# Patient Record
Sex: Female | Born: 1965 | Race: White | Hispanic: No | Marital: Married | Smoking: Never smoker
Health system: Southern US, Community
[De-identification: ages and names within clinical notes are randomized; demographics above are authoritative.]

## PROBLEM LIST (undated history)

## (undated) DIAGNOSIS — F419 Anxiety disorder, unspecified: Secondary | ICD-10-CM

## (undated) DIAGNOSIS — D649 Anemia, unspecified: Secondary | ICD-10-CM

## (undated) DIAGNOSIS — I10 Essential (primary) hypertension: Secondary | ICD-10-CM

## (undated) DIAGNOSIS — R519 Headache, unspecified: Secondary | ICD-10-CM

## (undated) DIAGNOSIS — R51 Headache: Secondary | ICD-10-CM

## (undated) DIAGNOSIS — K219 Gastro-esophageal reflux disease without esophagitis: Secondary | ICD-10-CM

## (undated) HISTORY — PX: POLYPECTOMY: SHX149

## (undated) HISTORY — DX: Essential (primary) hypertension: I10

## (undated) HISTORY — DX: Anxiety disorder, unspecified: F41.9

## (undated) HISTORY — PX: BREAST LUMPECTOMY: SHX2

---

## 1998-08-25 ENCOUNTER — Other Ambulatory Visit: Admission: RE | Admit: 1998-08-25 | Discharge: 1998-08-25 | Payer: Self-pay | Admitting: Obstetrics and Gynecology

## 1999-04-25 ENCOUNTER — Encounter: Admission: RE | Admit: 1999-04-25 | Discharge: 1999-07-24 | Payer: Self-pay | Admitting: Obstetrics and Gynecology

## 1999-07-12 ENCOUNTER — Inpatient Hospital Stay (HOSPITAL_COMMUNITY): Admission: AD | Admit: 1999-07-12 | Discharge: 1999-07-15 | Payer: Self-pay | Admitting: Obstetrics and Gynecology

## 1999-07-12 ENCOUNTER — Encounter (INDEPENDENT_AMBULATORY_CARE_PROVIDER_SITE_OTHER): Payer: Self-pay

## 1999-08-17 ENCOUNTER — Other Ambulatory Visit: Admission: RE | Admit: 1999-08-17 | Discharge: 1999-08-17 | Payer: Self-pay | Admitting: Obstetrics and Gynecology

## 2002-03-04 ENCOUNTER — Other Ambulatory Visit: Admission: RE | Admit: 2002-03-04 | Discharge: 2002-03-04 | Payer: Self-pay | Admitting: Obstetrics and Gynecology

## 2003-07-27 ENCOUNTER — Emergency Department (HOSPITAL_COMMUNITY): Admission: EM | Admit: 2003-07-27 | Discharge: 2003-07-27 | Payer: Self-pay | Admitting: Emergency Medicine

## 2003-07-27 ENCOUNTER — Encounter: Payer: Self-pay | Admitting: *Deleted

## 2006-07-10 ENCOUNTER — Encounter (INDEPENDENT_AMBULATORY_CARE_PROVIDER_SITE_OTHER): Payer: Self-pay | Admitting: Specialist

## 2006-07-10 ENCOUNTER — Encounter: Admission: RE | Admit: 2006-07-10 | Discharge: 2006-07-10 | Payer: Self-pay | Admitting: Obstetrics and Gynecology

## 2006-08-07 ENCOUNTER — Encounter: Admission: RE | Admit: 2006-08-07 | Discharge: 2006-08-07 | Payer: Self-pay | Admitting: Obstetrics and Gynecology

## 2007-02-20 ENCOUNTER — Emergency Department (HOSPITAL_COMMUNITY): Admission: EM | Admit: 2007-02-20 | Discharge: 2007-02-20 | Payer: Self-pay | Admitting: Emergency Medicine

## 2007-03-27 ENCOUNTER — Encounter: Admission: RE | Admit: 2007-03-27 | Discharge: 2007-03-27 | Payer: Self-pay | Admitting: Obstetrics and Gynecology

## 2007-04-04 ENCOUNTER — Encounter (INDEPENDENT_AMBULATORY_CARE_PROVIDER_SITE_OTHER): Payer: Self-pay | Admitting: Specialist

## 2007-04-04 ENCOUNTER — Encounter: Admission: RE | Admit: 2007-04-04 | Discharge: 2007-04-04 | Payer: Self-pay | Admitting: Family Medicine

## 2007-05-19 ENCOUNTER — Encounter (INDEPENDENT_AMBULATORY_CARE_PROVIDER_SITE_OTHER): Payer: Self-pay | Admitting: Surgery

## 2007-05-19 ENCOUNTER — Ambulatory Visit (HOSPITAL_BASED_OUTPATIENT_CLINIC_OR_DEPARTMENT_OTHER): Admission: RE | Admit: 2007-05-19 | Discharge: 2007-05-19 | Payer: Self-pay | Admitting: Surgery

## 2010-05-14 ENCOUNTER — Emergency Department (HOSPITAL_COMMUNITY): Admission: EM | Admit: 2010-05-14 | Discharge: 2010-05-14 | Payer: Self-pay | Admitting: Emergency Medicine

## 2011-03-19 LAB — URINALYSIS, ROUTINE W REFLEX MICROSCOPIC
Nitrite: NEGATIVE
Protein, ur: NEGATIVE mg/dL
Specific Gravity, Urine: <1.005 — ABNORMAL LOW (ref 1.005–1.030)
Urobilinogen, UA: 0.2 mg/dL (ref 0.0–1.0)

## 2011-03-19 LAB — URINE MICROSCOPIC-ADD ON

## 2011-03-19 LAB — URINE CULTURE

## 2011-05-18 NOTE — Op Note (Signed)
NAME:  Jennifer Landry, Jennifer Landry NO.:  [id number]   MEDICAL RECORD NO.:  [id number]          PATIENT TYPE:  AMB   LOCATION:  DSC                          FACILITY:  [facility]   PHYSICIAN:  Currie Paris, M.D.DATE OF BIRTH:  17-Jul-1966   DATE OF PROCEDURE:  05/19/2007  DATE OF DISCHARGE:                               OPERATIVE REPORT   OFFICE MEDICAL RECORD NUMBER [id number] [phone].   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   OPERATION:  Removal of right breast mass.   SURGEON:  Currie Paris, M.D.   ANESTHESIA:  MAC.   CLINICAL HISTORY:  This a 40-year lady who has had a prior right breast  biopsy apparently for a fibroadenoma done many years ago.  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   DESCRIPTION OF PROCEDURE:  The patient was seen in the holding area and  she had no further questions.  The right breast was marked as the  operative site. She is taken to the operating room.   In the operating room the mass, itself, was palpated and marked.  I also  used the ultrasound to identify and confirm the area that was palpable  was the area previously seen; and it was.  She then underwent IV  sedation and the breast was prepped and draped; and the time-out  occurred.   I infiltrated a combination of 1% plain Xylocaine and 0.5% Marcaine with  epi.  An incision was made.  I divided a little bit of subcutaneous  tissues.  I could palpate the mass; and attempted to put a 3-0 Vicryl  suture through it for traction.  Having placed that, and using some  traction I tried to come around the mass using a cutting current of the  cautery, taking a little bit of the fatty tissue.  As I was  working  around medially I found that I really had traction in the fatty tissue  with the suture; and the needle had come across the top of the mass  without actually grasping it.  I could, however, see the breast edge  adjacent to the mass.  I grasped that with an Revonda Standard, using that for  traction, and then did excisional biopsy trying to get a little bit of  margin in all directions, although very thin.  Grossly it would appear  that there was a positive margin simply because of some of the traction;  but, I believe, that we did have a little bit of a margin of all  directions.   The specimen was oriented for pathology.  The incision was checked for  hemostasis; and once everything was dry, closed in layers with 3-0  Vicryl followed by 4-0 Monocryl subcuticular and Dermabond.   The patient tolerated the procedure well. There no operative  complications.  All counts were correct.      Currie Paris, M.D.  Electronically Signed     CJS/MEDQ  D:  05/19/2007  T:  05/19/2007  Job:  [id number]   cc:   Miguel Aschoff, M.D.

## 2011-11-13 ENCOUNTER — Other Ambulatory Visit: Payer: Self-pay | Admitting: Obstetrics and Gynecology

## 2011-12-01 HISTORY — PX: COLPOSCOPY: SHX161

## 2011-12-05 ENCOUNTER — Other Ambulatory Visit: Payer: Self-pay | Admitting: Obstetrics and Gynecology

## 2014-01-27 ENCOUNTER — Other Ambulatory Visit: Payer: Self-pay | Admitting: Obstetrics and Gynecology

## 2015-02-03 ENCOUNTER — Other Ambulatory Visit: Payer: Self-pay | Admitting: Obstetrics and Gynecology

## 2015-02-04 LAB — CYTOLOGY - PAP

## 2016-03-29 ENCOUNTER — Other Ambulatory Visit: Payer: Self-pay | Admitting: Obstetrics & Gynecology

## 2016-03-29 DIAGNOSIS — R928 Other abnormal and inconclusive findings on diagnostic imaging of breast: Secondary | ICD-10-CM

## 2016-04-06 ENCOUNTER — Ambulatory Visit
Admission: RE | Admit: 2016-04-06 | Discharge: 2016-04-06 | Disposition: A | Discharge status: Home / Self Care | Payer: BLUE CROSS/BLUE SHIELD | Source: Ambulatory Visit | Attending: Obstetrics & Gynecology | Admitting: Obstetrics & Gynecology

## 2016-04-06 DIAGNOSIS — R928 Other abnormal and inconclusive findings on diagnostic imaging of breast: Secondary | ICD-10-CM

## 2016-12-28 ENCOUNTER — Encounter: Payer: Self-pay | Admitting: Gynecologic Oncology

## 2017-01-01 ENCOUNTER — Telehealth: Payer: Self-pay | Admitting: Gynecologic Oncology

## 2017-01-01 NOTE — Telephone Encounter (Signed)
Tc to the referring office to make them aware of the appt that has been scheduled for the to see Dr. Denman George on 1/19 at 10:45am. Left a vm for them to contact the pt with the information.

## 2017-01-18 ENCOUNTER — Ambulatory Visit: Payer: BLUE CROSS/BLUE SHIELD | Attending: Gynecologic Oncology | Admitting: Gynecologic Oncology

## 2017-01-18 ENCOUNTER — Encounter: Payer: Self-pay | Admitting: Gynecologic Oncology

## 2017-01-18 VITALS — BP 141/85 | HR 76 | Temp 98.3°F | Resp 16 | Ht 61.0 in | Wt 180.3 lb

## 2017-01-18 DIAGNOSIS — N920 Excessive and frequent menstruation with regular cycle: Secondary | ICD-10-CM | POA: Diagnosis not present

## 2017-01-18 DIAGNOSIS — D259 Leiomyoma of uterus, unspecified: Secondary | ICD-10-CM | POA: Insufficient documentation

## 2017-01-18 NOTE — Progress Notes (Signed)
Consult Note: Gyn-Onc  Consult was requested by Dr. Stann Mainland for the evaluation of Jennifer Landry 51 y.o. female  CC:  Chief Complaint  Patient presents with  . Fibroids    Assessment/Plan:  Jennifer Landry  is a 51 y.o.  year old with rapidly enlarging fibroid uterus.  We reviewed options for treatment. Given the size, symptom profile and rapid enlargement, I don't think she is a good candidate for uterine artery embolization. I am recommending hysterectomy (total). I think laparotomy would be most appropriate given the size of the fibroids into the upper abdomen. These are mostly exophytic/subserosal.  We will follow-up the results of today's biopsy and the MRI to better characterize the underlying potential for malignancy.  We had a lengthy discussion regarding surgical risk including  bleeding, infection, damage to internal organs (such as bladder,ureters, bowels), blood clot, reoperation and rehospitalization. I discussed that these risks are higher in a patient with bulky fibroids.  Surgery is scheduled for February.   HPI: Jennifer Landry is a very pleasant 51 year old P3 who is seen in consultation at the request of Dr Stann Mainland for treatment of symptomatic uterine fibroids.  The patient has a 6 month history of progressive mass and bulk symptoms including decreasing bladder capacity. She has also noted menorrhagia. She has a known history of uterine fibroids, however, 6 months previously these were approximately 8cm, and on repeat US on 12/25/16 they were as large as 15cm. Her paps have been normal.  She is otherwise very healthy, though has had 3 prior cesarean sections and no vaginal deliveries.    Current Meds:  Outpatient Encounter Prescriptions as of 01/18/2017  Medication Sig  . Multiple Vitamins-Minerals (MULTIVITAMIN WITH MINERALS) tablet Take 1 tablet by mouth daily.   No facility-administered encounter medications on file as of 01/18/2017.     Allergy: Not on  File  Social Hx:   Social History   Social History  . Marital status: Married    Spouse name: N/A  . Number of children: N/A  . Years of education: N/A   Occupational History  . Not on file.   Social History Main Topics  . Smoking status: Never Smoker  . Smokeless tobacco: Never Used  . Alcohol use No  . Drug use: No  . Sexual activity: Not on file   Other Topics Concern  . Not on file   Social History Narrative  . No narrative on file    Past Surgical Hx: History reviewed. No pertinent surgical history.  Past Medical Hx: History reviewed. No pertinent past medical history.  Past Gynecological History:  C/s x 3. Fibroids No LMP recorded.  Family Hx: History reviewed. No pertinent family history.  Review of Systems:  Constitutional  Feels well,    ENT Normal appearing ears and nares bilaterally Skin/Breast  No rash, sores, jaundice, itching, dryness Cardiovascular  No chest pain, shortness of breath, or edema  Pulmonary  No cough or wheeze.  Gastro Intestinal  No nausea, vomitting, or diarrhoea. No bright red blood per rectum, no abdominal pain, change in bowel movement, or constipation. + abdominal masses Genito Urinary  No frequency, urgency, dysuria, + menorrhagia Musculo Skeletal  No myalgia, arthralgia, joint swelling or pain  Neurologic  No weakness, numbness, change in gait,  Psychology  No depression, anxiety, insomnia.   Vitals:  Blood pressure (!) 141/85, pulse 76, temperature 98.3 F (36.8 C), resp. rate 16, height 5\' 1"  (1.549 m), weight 180 lb 4.8 oz (  81.8 kg), SpO2 100 %.  Physical Exam: WD in NAD Neck  Supple NROM, without any enlargements.  Lymph Node Survey No cervical supraclavicular or inguinal adenopathy Cardiovascular  Pulse normal rate, regularity and rhythm. S1 and S2 normal.  Lungs  Clear to auscultation bilateraly, without wheezes/crackles/rhonchi. Good air movement.  Skin  No rash/lesions/breakdown  Psychiatry  Alert  and oriented to person, place, and time  Abdomen  Normoactive bowel sounds, abdomen soft, non-tender and overweight without evidence of hernia. Large bulky mobile fibroids into upper abdomen below costal margin Back No CVA tenderness Genito Urinary  Vulva/vagina: Normal external female genitalia.   No lesions. No discharge or bleeding.  Bladder/urethra:  No lesions or masses, well supported bladder  Vagina: normal.  Cervix: Normal appearing, no lesions.  Uterus: Bulky, large, extending into upper abdomen, projects anteriorally more than posteriorally.  Adnexa: no discrete masses other than fibroids Rectal  Good tone, no masses no cul de sac nodularity.  Extremities  No bilateral cyanosis, clubbing or edema.  PROCEDURE: ENDOMETRIAL BIOPSY Patient provided verbal consent Time out performed Cervix visualized and pipelle placed twice through cervix to fundus at 10cm. Moderate tissue obtained. Minimal blood loss Specimen: endoemtrial biopsy   Donaciano Eva, MD  01/18/2017, 5:25 PM

## 2017-01-18 NOTE — Patient Instructions (Signed)
Preparing for your Surgery  Plan for surgery on February 21, 2017 with Dr. Everitt Amber  Pre-operative Testing -You will receive a phone call from presurgical testing at [facility] to arrange for a pre-operative testing appointment before your surgery.  This appointment normally occurs one to two weeks before your scheduled surgery.   -Bring your insurance card, copy of an advanced directive if applicable, medication list  -At that visit, you will be asked to sign a consent for a possible blood transfusion in case a transfusion becomes necessary during surgery.  The need for a blood transfusion is rare but having consent is a necessary part of your care.     -You should not be taking blood thinners or aspirin at least ten days prior to surgery unless instructed by your surgeon.  Day Before Surgery at [facility] will be asked to take in a light diet the day before surgery.  Avoid carbonated beverages.  You will be advised to have nothing to eat or drink after midnight the evening before.     Eat a light diet the day before surgery.  Examples including soups, broths,  toast, yogurt, mashed potatoes.  Things to avoid include carbonated beverages  (fizzy beverages), raw fruits and raw vegetables, or beans.    If your bowels are filled with gas, your surgeon will have difficulty  visualizing your pelvic organs which increases your surgical risks.  Your role in recovery Your role is to become active as soon as directed by your doctor, while still giving yourself time to heal.  Rest when you feel tired. You will be asked to do the following in order to speed your recovery:  - Cough and breathe deeply. This helps toclear and expand your lungs and can prevent pneumonia. You may be given a spirometer to practice deep breathing. A staff member will show you how to use the spirometer. - Do mild physical activity. Walking or moving your legs help your circulation and body functions  return to normal. A staff member will help you when you try to walk and will provide you with simple exercises. Do not try to get up or walk alone the first time. - Actively manage your pain. Managing your pain lets you move in comfort. We will ask you to rate your pain on a scale of zero to 10. It is your responsibility to tell your doctor or nurse where and how much you hurt so your pain can be treated.  Special Considerations -If you are diabetic, you may be placed on insulin after surgery to have closer control over your blood sugars to promote healing and recovery.  This does not mean that you will be discharged on insulin.  If applicable, your oral antidiabetics will be resumed when you are tolerating a solid diet.  -Your final pathology results from surgery should be available by the Friday after surgery and the results will be relayed to you when available.  Eat a light diet the day before surgery.  Examples including soups, broths, toast, yogurt, mashed potatoes.  Things to avoid include carbonated beverages (fizzy beverages), raw fruits and raw vegetables, or beans.   If your bowels are filled with gas, your surgeon will have difficulty visualizing your pelvic organs which increases your surgical risks. Blood Transfusion Information WHAT IS A BLOOD TRANSFUSION? A transfusion is the replacement of blood or some of its parts. Blood is made up of multiple cells which provide different functions.  Red blood cells  carry oxygen and are used for blood loss replacement.  White blood cells fight against infection.  Platelets control bleeding.  Plasma helps clot blood.  Other blood products are available for specialized needs, such as hemophilia or other clotting disorders. BEFORE THE TRANSFUSION  Who gives blood for transfusions?   You may be able to donate blood to be used at a later date on yourself (autologous donation).  Relatives can be asked to donate blood. This is generally not  any safer than if you have received blood from a stranger. The same precautions are taken to ensure safety when a relative's blood is donated.  Healthy volunteers who are fully evaluated to make sure their blood is safe. This is blood bank blood. Transfusion therapy is the safest it has ever been in the practice of medicine. Before blood is taken from a donor, a complete history is taken to make sure that person has no history of diseases nor engages in risky social behavior (examples are intravenous drug use or sexual activity with multiple partners). The donor's travel history is screened to minimize risk of transmitting infections, such as malaria. The donated blood is tested for signs of infectious diseases, such as HIV and hepatitis. The blood is then tested to be sure it is compatible with you in order to minimize the chance of a transfusion reaction. If you or a relative donates blood, this is often done in anticipation of surgery and is not appropriate for emergency situations. It takes many days to process the donated blood. RISKS AND COMPLICATIONS Although transfusion therapy is very safe and saves many lives, the main dangers of transfusion include:   Getting an infectious disease.  Developing a transfusion reaction. This is an allergic reaction to something in the blood you were given. Every precaution is taken to prevent this. The decision to have a blood transfusion has been considered carefully by your caregiver before blood is given. Blood is not given unless the benefits outweigh the risks.  

## 2017-01-23 ENCOUNTER — Ambulatory Visit (HOSPITAL_COMMUNITY)
Admission: RE | Admit: 2017-01-23 | Discharge: 2017-01-23 | Disposition: A | Discharge status: Home / Self Care | Payer: BLUE CROSS/BLUE SHIELD | Source: Ambulatory Visit | Attending: Gynecologic Oncology | Admitting: Gynecologic Oncology

## 2017-01-23 ENCOUNTER — Telehealth: Payer: Self-pay

## 2017-01-23 ENCOUNTER — Other Ambulatory Visit: Payer: Self-pay | Admitting: Gynecologic Oncology

## 2017-01-23 ENCOUNTER — Encounter (HOSPITAL_COMMUNITY): Payer: Self-pay | Admitting: Radiology

## 2017-01-23 DIAGNOSIS — D259 Leiomyoma of uterus, unspecified: Secondary | ICD-10-CM | POA: Insufficient documentation

## 2017-01-23 DIAGNOSIS — N83202 Unspecified ovarian cyst, left side: Secondary | ICD-10-CM | POA: Diagnosis not present

## 2017-01-23 DIAGNOSIS — F418 Other specified anxiety disorders: Secondary | ICD-10-CM

## 2017-01-23 MED ORDER — LORAZEPAM 1 MG PO TABS: 1.0000 mg | ORAL_TABLET | Freq: Once | ORAL | 0 refills | Status: AC

## 2017-01-23 MED ORDER — GADOBENATE DIMEGLUMINE 529 MG/ML IV SOLN
17.0000 mL | Freq: Once | INTRAVENOUS | Status: AC | PRN
  Administered 2017-01-23: 17 mL via INTRAVENOUS

## 2017-01-23 NOTE — Telephone Encounter (Signed)
Pt informed of endometrial biopsy results. Verbalized understanding.

## 2017-01-29 ENCOUNTER — Telehealth: Payer: Self-pay | Admitting: Gynecologic Oncology

## 2017-01-29 NOTE — Telephone Encounter (Signed)
Patient would like to keep her surgery on Feb 22 because work related issues and financial issues.  Advised to call for any concerns or needs.

## 2017-01-29 NOTE — Patient Instructions (Signed)
KERRYN CADWALLADER  01/29/2017   Your procedure is scheduled on:   Report to [facility] Main  Entrance take [facility]  elevators to 3rd floor to  [place] at AM.  Call this number if you have problems the morning of surgery [phone]   Remember: ONLY 1 PERSON MAY GO WITH YOU TO SHORT STAY TO GET  READY MORNING OF [place].  Do not eat food or drink liquids :After Midnight.     Take these medicines the morning of surgery with A SIP OF WATER:  DO NOT TAKE ANY DIABETIC MEDICATIONS DAY OF YOUR SURGERY                               You may not have any metal on your body including hair pins and              piercings  Do not wear jewelry, make-up, lotions, powders or perfumes, deodorant             Do not wear nail polish.  Do not shave  48 hours prior to surgery.              Men may shave face and neck.   Do not bring valuables to the hospital. [place].  Contacts, dentures or bridgework may not be worn into surgery.  Leave suitcase in the car. After surgery it may be brought to your room.     Patients discharged the day of surgery will not be allowed to drive home.  Name and phone number of your driver:  Special Instructions: N/A              Please read over the following fact sheets you were given: _____________________________________________________________________             Eat a light diet the day before surgery.  Examples including soups, broths, toast, yogurt, mashed potatoes.  Things to avoid include carbonated beverages (fizzy beverages), raw fruits and raw vegetables, or beans.   If your bowels are filled with gas, your surgeon will have difficulty visualizing your pelvic organs which increases your surgical risks.[place] - Preparing for Surgery Before surgery, you can play an important role.  Because skin is not sterile, your skin needs to be as free of germs as possible.  You  can reduce the number of germs on your skin by washing with CHG (chlorahexidine gluconate) soap before surgery.  CHG is an antiseptic cleaner which kills germs and bonds with the skin to continue killing germs even after washing. Please DO NOT use if you have an allergy to CHG or antibacterial soaps.  If your skin becomes reddened/irritated stop using the CHG and inform your nurse when you arrive at Short Stay. Do not shave (including legs and underarms) for at least 48 hours prior to the first CHG shower.  You may shave your face/neck. Please follow these instructions carefully:  1.  Shower with CHG Soap the night before surgery and the  morning of Surgery.  2.  If you choose to wash your hair, wash your hair first as usual with your  normal  shampoo.  3.  After you shampoo, rinse your hair and body thoroughly to remove  the  shampoo.                           4.  Use CHG as you would any other liquid soap.  You can apply chg directly  to the skin and wash                       Gently with a scrungie or clean washcloth.  5.  Apply the CHG Soap to your body ONLY FROM THE NECK DOWN.   Do not use on face/ open                           Wound or open sores. Avoid contact with eyes, ears mouth and genitals (private parts).                       Wash face,  Genitals (private parts) with your normal soap.             6.  Wash thoroughly, paying special attention to the area where your surgery  will be performed.  7.  Thoroughly rinse your body with warm water from the neck down.  8.  DO NOT shower/wash with your normal soap after using and rinsing off  the CHG Soap.                9.  Pat yourself dry with a clean towel.            10.  Wear clean pajamas.            11.  Place clean sheets on your bed the night of your first shower and do not  sleep with pets. Day of Surgery : Do not apply any lotions/deodorants the morning of surgery.  Please wear clean clothes to the hospital/surgery center.  FAILURE  TO FOLLOW THESE INSTRUCTIONS MAY RESULT IN THE CANCELLATION OF YOUR SURGERY PATIENT SIGNATURE_________________________________  NURSE SIGNATURE__________________________________  ________________________________________________________________________   Adam Phenix  An incentive spirometer is a tool that can help keep your lungs clear and active. This tool measures how well you are filling your lungs with each breath. Taking long deep breaths may help reverse or decrease the chance of developing breathing (pulmonary) problems (especially infection) following:  A long period of time when you are unable to move or be active. BEFORE THE PROCEDURE   If the spirometer includes an indicator to show your best effort, your nurse or respiratory therapist will set it to a desired goal.  If possible, sit up straight or lean slightly forward. Try not to slouch.  Hold the incentive spirometer in an upright position. INSTRUCTIONS FOR USE  1. Sit on the edge of your bed if possible, or sit up as far as you can in bed or on a chair. 2. Hold the incentive spirometer in an upright position. 3. Breathe out normally. 4. Place the mouthpiece in your mouth and seal your lips tightly around it. 5. Breathe in slowly and as deeply as possible, raising the piston or the ball toward the top of the column. 6. Hold your breath for 3-5 seconds or for as long as possible. Allow the piston or ball to fall to the bottom of the column. 7. Remove the mouthpiece from your mouth and breathe out normally. 8. Rest for a few seconds and repeat Steps 1 through 7 at least 10  times every 1-2 hours when you are awake. Take your time and take a few normal breaths between deep breaths. 9. The spirometer may include an indicator to show your best effort. Use the indicator as a goal to work toward during each repetition. 10. After each set of 10 deep breaths, practice coughing to be sure your lungs are clear. If you have an  incision (the cut made at the time of surgery), support your incision when coughing by placing a pillow or rolled up towels firmly against it. Once you are able to get out of bed, walk around indoors and cough well. You may stop using the incentive spirometer when instructed by your caregiver.  RISKS AND COMPLICATIONS  Take your time so you do not get dizzy or light-headed.  If you are in pain, you may need to take or ask for pain medication before doing incentive spirometry. It is harder to take a deep breath if you are having pain. AFTER USE  Rest and breathe slowly and easily.  It can be helpful to keep track of a log of your progress. Your caregiver can provide you with a simple table to help with this. If you are using the spirometer at home, follow these instructions: [place] IF:   You are having difficultly using the spirometer.  You have trouble using the spirometer as often as instructed.  Your pain medication is not giving enough relief while using the spirometer.  You develop fever of 100.5 F (38.1 C) or higher. SEEK IMMEDIATE MEDICAL CARE IF:   You cough up bloody sputum that had not been present before.  You develop fever of 102 F (38.9 C) or greater.  You develop worsening pain at or near the incision site. MAKE SURE YOU:   Understand these instructions.  Will watch your condition.  Will get help right away if you are not doing well or get worse. Document Released: 04/29/2007 Document Revised: 03/10/2012 Document Reviewed: 06/30/2007 ExitCare Patient Information 2014 ExitCare, [place].   ________________________________________________________________________  WHAT IS A BLOOD TRANSFUSION? Blood Transfusion Information  A transfusion is the replacement of blood or some of its parts. Blood is made up of multiple cells which provide different functions.  Red blood cells carry oxygen and are used for blood loss replacement.  White blood cells  fight against infection.  Platelets control bleeding.  Plasma helps clot blood.  Other blood products are available for specialized needs, such as hemophilia or other clotting disorders. BEFORE THE TRANSFUSION  Who gives blood for transfusions?   Healthy volunteers who are fully evaluated to make sure their blood is safe. This is blood bank blood. Transfusion therapy is the safest it has ever been in the practice of medicine. Before blood is taken from a donor, a complete history is taken to make sure that person has no history of diseases nor engages in risky social behavior (examples are intravenous drug use or sexual activity with multiple partners). The donor's travel history is screened to minimize risk of transmitting infections, such as malaria. The donated blood is tested for signs of infectious diseases, such as HIV and hepatitis. The blood is then tested to be sure it is compatible with you in order to minimize the chance of a transfusion reaction. If you or a relative donates blood, this is often done in anticipation of surgery and is not appropriate for emergency situations. It takes many days to process the donated blood. RISKS AND COMPLICATIONS Although transfusion therapy is  very safe and saves many lives, the main dangers of transfusion include:   Getting an infectious disease.  Developing a transfusion reaction. This is an allergic reaction to something in the blood you were given. Every precaution is taken to prevent this. The decision to have a blood transfusion has been considered carefully by your caregiver before blood is given. Blood is not given unless the benefits outweigh the risks. AFTER THE TRANSFUSION  Right after receiving a blood transfusion, you will usually feel much better and more energetic. This is especially true if your red blood cells have gotten low (anemic). The transfusion raises the level of the red blood cells which carry oxygen, and this usually  causes an energy increase.  The nurse administering the transfusion will monitor you carefully for complications. HOME CARE INSTRUCTIONS  No special instructions are needed after a transfusion. You may find your energy is better. Speak with your caregiver about any limitations on activity for underlying diseases you may have. SEEK MEDICAL CARE IF:   Your condition is not improving after your transfusion.  You develop redness or irritation at the intravenous (IV) site. SEEK IMMEDIATE MEDICAL CARE IF:  Any of the following symptoms occur over the next 12 hours:  Shaking chills.  You have a temperature by mouth above 102 F (38.9 C), not controlled by medicine.  Chest, back, or muscle pain.  People around you feel you are not acting correctly or are confused.  Shortness of breath or difficulty breathing.  Dizziness and fainting.  You get a rash or develop hives.  You have a decrease in urine output.  Your urine turns a dark color or changes to pink, red, or brown. Any of the following symptoms occur over the next 10 days:  You have a temperature by mouth above 102 F (38.9 C), not controlled by medicine.  Shortness of breath.  Weakness after normal activity.  The white part of the eye turns yellow (jaundice).  You have a decrease in the amount of urine or are urinating less often.  Your urine turns a dark color or changes to pink, red, or brown. Document Released: 12/14/2000 Document Revised: 03/10/2012 Document Reviewed: 08/02/2008 [facility] Patient Information 2014 [place], [place].  _______________________________________________________________________

## 2017-01-29 NOTE — Telephone Encounter (Signed)
Called patient to see if she would like to move her surgery up to this Thursday.  She wanted to think about it and call the office.  Advised her to please call the office back in ten to fifteen minutes with her decision.  Spoke with Pre-Surgical as well and informed them of the possibility of moving her surgery up.

## 2017-01-30 ENCOUNTER — Inpatient Hospital Stay (HOSPITAL_COMMUNITY)
Admission: RE | Admit: 2017-01-30 | Discharge: 2017-01-30 | Disposition: A | Discharge status: Home / Self Care | Payer: BLUE CROSS/BLUE SHIELD | Source: Ambulatory Visit

## 2017-02-11 NOTE — Patient Instructions (Addendum)
GRACELYNNE KUCHAR  02/11/2017   Your procedure is scheduled on: 02-21-17  Report to [facility] Main  Entrance take [facility]  elevators to 3rd floor to  [place] at 530AM.  Call this number if you have problems the morning of surgery [phone]   Remember: ONLY 1 PERSON MAY GO WITH YOU TO SHORT STAY TO GET  READY MORNING OF [place].  Do not eat food or drink liquids :After Midnight.     Take these medicines the morning of surgery with A SIP OF WATER: tylenol as needed                                You may not have any metal on your body including hair pins and              piercings  Do not wear jewelry, make-up, lotions, powders or perfumes, deodorant             Do not wear nail polish.  Do not shave  48 hours prior to surgery.              Men may shave face and neck.   Do not bring valuables to the hospital. [place].  Contacts, dentures or bridgework may not be worn into surgery.  Leave suitcase in the car. After surgery it may be brought to your room.               Please read over the following fact sheets you were given: _____________________________________________________________________                Eat a light diet the day before surgery.  Examples including soups, broths, toast, yogurt, mashed potatoes.  Things to avoid include carbonated beverages (fizzy beverages), raw fruits and raw vegetables, or beans.   If your bowels are filled with gas, your surgeon will have difficulty visualizing your pelvic organs which increases your surgical risks.   [place] - Preparing for Surgery Before surgery, you can play an important role.  Because skin is not sterile, your skin needs to be as free of germs as possible.  You can reduce the number of germs on your skin by washing with CHG (chlorahexidine gluconate) soap before surgery.  CHG is an antiseptic cleaner which kills germs and bonds  with the skin to continue killing germs even after washing. Please DO NOT use if you have an allergy to CHG or antibacterial soaps.  If your skin becomes reddened/irritated stop using the CHG and inform your nurse when you arrive at Short Stay. Do not shave (including legs and underarms) for at least 48 hours prior to the first CHG shower.  You may shave your face/neck. Please follow these instructions carefully:  1.  Shower with CHG Soap the night before surgery and the  morning of Surgery.  2.  If you choose to wash your hair, wash your hair first as usual with your  normal  shampoo.  3.  After you shampoo, rinse your hair and body thoroughly to remove the  shampoo.  4.  Use CHG as you would any other liquid soap.  You can apply chg directly  to the skin and wash                       Gently with a scrungie or clean washcloth.  5.  Apply the CHG Soap to your body ONLY FROM THE NECK DOWN.   Do not use on face/ open                           Wound or open sores. Avoid contact with eyes, ears mouth and genitals (private parts).                       Wash face,  Genitals (private parts) with your normal soap.             6.  Wash thoroughly, paying special attention to the area where your surgery  will be performed.  7.  Thoroughly rinse your body with warm water from the neck down.  8.  DO NOT shower/wash with your normal soap after using and rinsing off  the CHG Soap.                9.  Pat yourself dry with a clean towel.            10.  Wear clean pajamas.            11.  Place clean sheets on your bed the night of your first shower and do not  sleep with pets. Day of Surgery : Do not apply any lotions/deodorants the morning of surgery.  Please wear clean clothes to the hospital/surgery center.  FAILURE TO FOLLOW THESE INSTRUCTIONS MAY RESULT IN THE CANCELLATION OF YOUR SURGERY PATIENT SIGNATURE_________________________________  NURSE  SIGNATURE__________________________________  ________________________________________________________________________   Adam Phenix  An incentive spirometer is a tool that can help keep your lungs clear and active. This tool measures how well you are filling your lungs with each breath. Taking long deep breaths may help reverse or decrease the chance of developing breathing (pulmonary) problems (especially infection) following:  A long period of time when you are unable to move or be active. BEFORE THE PROCEDURE   If the spirometer includes an indicator to show your best effort, your nurse or respiratory therapist will set it to a desired goal.  If possible, sit up straight or lean slightly forward. Try not to slouch.  Hold the incentive spirometer in an upright position. INSTRUCTIONS FOR USE  1. Sit on the edge of your bed if possible, or sit up as far as you can in bed or on a chair. 2. Hold the incentive spirometer in an upright position. 3. Breathe out normally. 4. Place the mouthpiece in your mouth and seal your lips tightly around it. 5. Breathe in slowly and as deeply as possible, raising the piston or the ball toward the top of the column. 6. Hold your breath for 3-5 seconds or for as long as possible. Allow the piston or ball to fall to the bottom of the column. 7. Remove the mouthpiece from your mouth and breathe out normally. 8. Rest for a few seconds and repeat Steps 1 through 7 at least 10 times every 1-2 hours when you are awake. Take your time and take a few normal breaths between deep breaths. 9. The spirometer may include an indicator to show  your best effort. Use the indicator as a goal to work toward during each repetition. 10. After each set of 10 deep breaths, practice coughing to be sure your lungs are clear. If you have an incision (the cut made at the time of surgery), support your incision when coughing by placing a pillow or rolled up towels firmly  against it. Once you are able to get out of bed, walk around indoors and cough well. You may stop using the incentive spirometer when instructed by your caregiver.  RISKS AND COMPLICATIONS  Take your time so you do not get dizzy or light-headed.  If you are in pain, you may need to take or ask for pain medication before doing incentive spirometry. It is harder to take a deep breath if you are having pain. AFTER USE  Rest and breathe slowly and easily.  It can be helpful to keep track of a log of your progress. Your caregiver can provide you with a simple table to help with this. If you are using the spirometer at home, follow these instructions: [place] IF:   You are having difficultly using the spirometer.  You have trouble using the spirometer as often as instructed.  Your pain medication is not giving enough relief while using the spirometer.  You develop fever of 100.5 F (38.1 C) or higher. SEEK IMMEDIATE MEDICAL CARE IF:   You cough up bloody sputum that had not been present before.  You develop fever of 102 F (38.9 C) or greater.  You develop worsening pain at or near the incision site. MAKE SURE YOU:   Understand these instructions.  Will watch your condition.  Will get help right away if you are not doing well or get worse. Document Released: 04/29/2007 Document Revised: 03/10/2012 Document Reviewed: 06/30/2007 Exit[employer] Patient Information 2014 ExitCare, [place].   ________________________________________________________________________  WHAT IS A BLOOD TRANSFUSION? Blood Transfusion Information  A transfusion is the replacement of blood or some of its parts. Blood is made up of multiple cells which provide different functions.  Red blood cells carry oxygen and are used for blood loss replacement.  White blood cells fight against infection.  Platelets control bleeding.  Plasma helps clot blood.  Other blood products are available for  specialized needs, such as hemophilia or other clotting disorders. BEFORE THE TRANSFUSION  Who gives blood for transfusions?   Healthy volunteers who are fully evaluated to make sure their blood is safe. This is blood bank blood. Transfusion therapy is the safest it has ever been in the practice of medicine. Before blood is taken from a donor, a complete history is taken to make sure that person has no history of diseases nor engages in risky social behavior (examples are intravenous drug use or sexual activity with multiple partners). The donor's travel history is screened to minimize risk of transmitting infections, such as malaria. The donated blood is tested for signs of infectious diseases, such as HIV and hepatitis. The blood is then tested to be sure it is compatible with you in order to minimize the chance of a transfusion reaction. If you or a relative donates blood, this is often done in anticipation of surgery and is not appropriate for emergency situations. It takes many days to process the donated blood. RISKS AND COMPLICATIONS Although transfusion therapy is very safe and saves many lives, the main dangers of transfusion include:   Getting an infectious disease.  Developing a transfusion reaction. This is an allergic reaction to  something in the blood you were given. Every precaution is taken to prevent this. The decision to have a blood transfusion has been considered carefully by your caregiver before blood is given. Blood is not given unless the benefits outweigh the risks. AFTER THE TRANSFUSION  Right after receiving a blood transfusion, you will usually feel much better and more energetic. This is especially true if your red blood cells have gotten low (anemic). The transfusion raises the level of the red blood cells which carry oxygen, and this usually causes an energy increase.  The nurse administering the transfusion will monitor you carefully for complications. HOME CARE  INSTRUCTIONS  No special instructions are needed after a transfusion. You may find your energy is better. Speak with your caregiver about any limitations on activity for underlying diseases you may have. SEEK MEDICAL CARE IF:   Your condition is not improving after your transfusion.  You develop redness or irritation at the intravenous (IV) site. SEEK IMMEDIATE MEDICAL CARE IF:  Any of the following symptoms occur over the next 12 hours:  Shaking chills.  You have a temperature by mouth above 102 F (38.9 C), not controlled by medicine.  Chest, back, or muscle pain.  People around you feel you are not acting correctly or are confused.  Shortness of breath or difficulty breathing.  Dizziness and fainting.  You get a rash or develop hives.  You have a decrease in urine output.  Your urine turns a dark color or changes to pink, red, or brown. Any of the following symptoms occur over the next 10 days:  You have a temperature by mouth above 102 F (38.9 C), not controlled by medicine.  Shortness of breath.  Weakness after normal activity.  The white part of the eye turns yellow (jaundice).  You have a decrease in the amount of urine or are urinating less often.  Your urine turns a dark color or changes to pink, red, or brown. Document Released: 12/14/2000 Document Revised: 03/10/2012 Document Reviewed: 08/02/2008 [facility] Patient Information 2014 [place], [place].  _______________________________________________________________________

## 2017-02-13 ENCOUNTER — Encounter (HOSPITAL_COMMUNITY): Payer: Self-pay

## 2017-02-13 ENCOUNTER — Encounter (HOSPITAL_COMMUNITY)
Admission: RE | Admit: 2017-02-13 | Discharge: 2017-02-13 | Disposition: A | Discharge status: Home / Self Care | Payer: BLUE CROSS/BLUE SHIELD | Source: Ambulatory Visit | Attending: Gynecologic Oncology | Admitting: Gynecologic Oncology

## 2017-02-13 DIAGNOSIS — Z01812 Encounter for preprocedural laboratory examination: Secondary | ICD-10-CM | POA: Insufficient documentation

## 2017-02-13 HISTORY — DX: Anemia, unspecified: D64.9

## 2017-02-13 HISTORY — DX: Headache: R51

## 2017-02-13 HISTORY — DX: Headache, unspecified: R51.9

## 2017-02-13 HISTORY — DX: Gastro-esophageal reflux disease without esophagitis: K21.9

## 2017-02-13 LAB — CBC WITH DIFFERENTIAL/PLATELET
BASOS PCT: 0 %
Basophils Absolute: 0 10*3/uL (ref 0.0–0.1)
Eosinophils Absolute: 0.1 10*3/uL (ref 0.0–0.7)
Eosinophils Relative: 2 %
HCT: 36.9 % (ref 36.0–46.0)
HEMOGLOBIN: 11.2 g/dL — AB (ref 12.0–15.0)
LYMPHS PCT: 23 %
Lymphs Abs: 1.3 10*3/uL (ref 0.7–4.0)
MCH: 22.3 pg — AB (ref 26.0–34.0)
MCHC: 30.4 g/dL (ref 30.0–36.0)
MCV: 73.4 fL — AB (ref 78.0–100.0)
MONOS PCT: 8 %
Monocytes Absolute: 0.4 10*3/uL (ref 0.1–1.0)
NEUTROS ABS: 3.8 10*3/uL (ref 1.7–7.7)
Neutrophils Relative %: 67 %
Platelets: 245 10*3/uL (ref 150–400)
RBC: 5.03 MIL/uL (ref 3.87–5.11)
RDW: 19.8 % — ABNORMAL HIGH (ref 11.5–15.5)
WBC: 5.6 10*3/uL (ref 4.0–10.5)

## 2017-02-13 LAB — URINALYSIS, ROUTINE W REFLEX MICROSCOPIC
Bilirubin Urine: NEGATIVE
GLUCOSE, UA: NEGATIVE mg/dL
Ketones, ur: NEGATIVE mg/dL
Leukocytes, UA: NEGATIVE
Nitrite: NEGATIVE
PROTEIN: NEGATIVE mg/dL
Specific Gravity, Urine: 1.02 (ref 1.005–1.030)
pH: 5 (ref 5.0–8.0)

## 2017-02-13 LAB — COMPREHENSIVE METABOLIC PANEL
ALK PHOS: 54 U/L (ref 38–126)
ALT: 15 U/L (ref 14–54)
AST: 22 U/L (ref 15–41)
Albumin: 3.8 g/dL (ref 3.5–5.0)
Anion gap: 5 (ref 5–15)
BUN: 17 mg/dL (ref 6–20)
CO2: 29 mmol/L (ref 22–32)
CREATININE: 0.74 mg/dL (ref 0.44–1.00)
Calcium: 9.2 mg/dL (ref 8.9–10.3)
Chloride: 105 mmol/L (ref 101–111)
Glucose, Bld: 106 mg/dL — ABNORMAL HIGH (ref 65–99)
Potassium: 4 mmol/L (ref 3.5–5.1)
Sodium: 139 mmol/L (ref 135–145)
Total Bilirubin: 0.6 mg/dL (ref 0.3–1.2)
Total Protein: 6.5 g/dL (ref 6.5–8.1)

## 2017-02-13 LAB — PREGNANCY, URINE: Preg Test, Ur: NEGATIVE

## 2017-02-21 ENCOUNTER — Inpatient Hospital Stay (HOSPITAL_COMMUNITY): Payer: BLUE CROSS/BLUE SHIELD | Admitting: Anesthesiology

## 2017-02-21 ENCOUNTER — Encounter (HOSPITAL_COMMUNITY)
Admission: RE | Disposition: A | Discharge status: Home / Self Care | Payer: Self-pay | Source: Ambulatory Visit | Attending: Gynecologic Oncology

## 2017-02-21 ENCOUNTER — Inpatient Hospital Stay (HOSPITAL_COMMUNITY)
Admission: RE | Admit: 2017-02-21 | Discharge: 2017-02-24 | DRG: 743 | Disposition: A | Discharge status: Home / Self Care | Payer: BLUE CROSS/BLUE SHIELD | Source: Ambulatory Visit | Attending: Gynecologic Oncology | Admitting: Gynecologic Oncology

## 2017-02-21 ENCOUNTER — Encounter (HOSPITAL_COMMUNITY): Payer: Self-pay | Admitting: *Deleted

## 2017-02-21 DIAGNOSIS — N920 Excessive and frequent menstruation with regular cycle: Secondary | ICD-10-CM | POA: Diagnosis present

## 2017-02-21 DIAGNOSIS — N83202 Unspecified ovarian cyst, left side: Secondary | ICD-10-CM | POA: Diagnosis present

## 2017-02-21 DIAGNOSIS — D259 Leiomyoma of uterus, unspecified: Secondary | ICD-10-CM | POA: Diagnosis present

## 2017-02-21 DIAGNOSIS — D649 Anemia, unspecified: Secondary | ICD-10-CM

## 2017-02-21 HISTORY — PX: HYSTERECTOMY ABDOMINAL WITH SALPINGECTOMY: SHX6725

## 2017-02-21 LAB — ABO/RH: ABO/RH(D): B POS

## 2017-02-21 SURGERY — HYSTERECTOMY, TOTAL, ABDOMINAL, WITH SALPINGECTOMY
Anesthesia: General | Laterality: Bilateral

## 2017-02-21 MED ORDER — OXYCODONE HCL 5 MG PO TABS
5.0000 mg | ORAL_TABLET | ORAL | Status: DC | PRN
  Administered 2017-02-22 – 2017-02-23 (×2): 5 mg via ORAL
  Filled 2017-02-21 (×3): qty 1

## 2017-02-21 MED ORDER — MIDAZOLAM HCL 2 MG/2ML IJ SOLN
INTRAMUSCULAR | Status: DC | PRN
  Administered 2017-02-21 (×2): 1 mg via INTRAVENOUS

## 2017-02-21 MED ORDER — SUCCINYLCHOLINE CHLORIDE 200 MG/10ML IV SOSY
PREFILLED_SYRINGE | INTRAVENOUS | Status: AC
  Filled 2017-02-21: qty 10

## 2017-02-21 MED ORDER — BUPIVACAINE LIPOSOME 1.3 % IJ SUSP
20.0000 mL | Freq: Once | INTRAMUSCULAR | Status: AC
  Administered 2017-02-21: 20 mL
  Filled 2017-02-21: qty 20

## 2017-02-21 MED ORDER — 0.9 % SODIUM CHLORIDE (POUR BTL) OPTIME
TOPICAL | Status: DC | PRN
  Administered 2017-02-21: 2000 mL

## 2017-02-21 MED ORDER — ONDANSETRON HCL 4 MG PO TABS: 4.0000 mg | ORAL_TABLET | Freq: Four times a day (QID) | ORAL | Status: DC | PRN

## 2017-02-21 MED ORDER — ALBUMIN HUMAN 5 % IV SOLN
INTRAVENOUS | Status: DC | PRN
  Administered 2017-02-21 (×2): via INTRAVENOUS

## 2017-02-21 MED ORDER — BUPIVACAINE HCL (PF) 0.25 % IJ SOLN
INTRAMUSCULAR | Status: AC
  Filled 2017-02-21: qty 30

## 2017-02-21 MED ORDER — FENTANYL CITRATE (PF) 100 MCG/2ML IJ SOLN
INTRAMUSCULAR | Status: AC
  Filled 2017-02-21: qty 4

## 2017-02-21 MED ORDER — SODIUM CHLORIDE 0.9 % IJ SOLN
INTRAMUSCULAR | Status: DC | PRN
  Administered 2017-02-21: 40 mL

## 2017-02-21 MED ORDER — ENSURE ENLIVE PO LIQD
237.0000 mL | Freq: Two times a day (BID) | ORAL | Status: DC
  Administered 2017-02-22 – 2017-02-23 (×2): 237 mL via ORAL

## 2017-02-21 MED ORDER — CEFAZOLIN SODIUM-DEXTROSE 2-4 GM/100ML-% IV SOLN
INTRAVENOUS | Status: AC
  Filled 2017-02-21: qty 100

## 2017-02-21 MED ORDER — DEXAMETHASONE SODIUM PHOSPHATE 10 MG/ML IJ SOLN
INTRAMUSCULAR | Status: AC
  Filled 2017-02-21: qty 1

## 2017-02-21 MED ORDER — SODIUM CHLORIDE 0.9 % IJ SOLN
INTRAMUSCULAR | Status: AC
  Filled 2017-02-21: qty 20

## 2017-02-21 MED ORDER — ALBUMIN HUMAN 5 % IV SOLN
INTRAVENOUS | Status: AC
  Filled 2017-02-21: qty 250

## 2017-02-21 MED ORDER — SUGAMMADEX SODIUM 200 MG/2ML IV SOLN
INTRAVENOUS | Status: AC
  Filled 2017-02-21: qty 2

## 2017-02-21 MED ORDER — HYDROMORPHONE HCL 2 MG/ML IJ SOLN
0.5000 mg | INTRAMUSCULAR | Status: DC | PRN
  Administered 2017-02-22: 0.5 mg via INTRAVENOUS
  Filled 2017-02-21: qty 1

## 2017-02-21 MED ORDER — ACETAMINOPHEN 500 MG PO TABS
1000.0000 mg | ORAL_TABLET | Freq: Four times a day (QID) | ORAL | Status: DC
  Administered 2017-02-21 – 2017-02-24 (×10): 1000 mg via ORAL
  Filled 2017-02-21 (×11): qty 2

## 2017-02-21 MED ORDER — IBUPROFEN 800 MG PO TABS
800.0000 mg | ORAL_TABLET | Freq: Four times a day (QID) | ORAL | Status: DC
  Administered 2017-02-22 – 2017-02-24 (×6): 800 mg via ORAL
  Filled 2017-02-21 (×7): qty 1

## 2017-02-21 MED ORDER — ACETAMINOPHEN 10 MG/ML IV SOLN
INTRAVENOUS | Status: AC
  Filled 2017-02-21: qty 100

## 2017-02-21 MED ORDER — ACETAMINOPHEN 10 MG/ML IV SOLN
1000.0000 mg | Freq: Once | INTRAVENOUS | Status: AC
  Administered 2017-02-21: 1000 mg via INTRAVENOUS

## 2017-02-21 MED ORDER — ENOXAPARIN SODIUM 40 MG/0.4ML ~~LOC~~ SOLN
40.0000 mg | SUBCUTANEOUS | Status: AC
  Administered 2017-02-21: 40 mg via SUBCUTANEOUS
  Filled 2017-02-21: qty 0.4

## 2017-02-21 MED ORDER — PROPOFOL 10 MG/ML IV BOLUS
INTRAVENOUS | Status: DC | PRN
  Administered 2017-02-21: 160 mg via INTRAVENOUS

## 2017-02-21 MED ORDER — SUGAMMADEX SODIUM 200 MG/2ML IV SOLN
INTRAVENOUS | Status: DC | PRN
  Administered 2017-02-21: 200 mg via INTRAVENOUS

## 2017-02-21 MED ORDER — MIDAZOLAM HCL 2 MG/2ML IJ SOLN
INTRAMUSCULAR | Status: AC
  Filled 2017-02-21: qty 2

## 2017-02-21 MED ORDER — DEXAMETHASONE SODIUM PHOSPHATE 10 MG/ML IJ SOLN
INTRAMUSCULAR | Status: DC | PRN
  Administered 2017-02-21: 10 mg via INTRAVENOUS

## 2017-02-21 MED ORDER — MAGNESIUM HYDROXIDE 400 MG/5ML PO SUSP
30.0000 mL | Freq: Three times a day (TID) | ORAL | Status: AC
  Administered 2017-02-21 – 2017-02-22 (×2): 30 mL via ORAL
  Filled 2017-02-21 (×3): qty 30

## 2017-02-21 MED ORDER — LACTATED RINGERS IV SOLN
INTRAVENOUS | Status: DC | PRN
  Administered 2017-02-21 (×3): via INTRAVENOUS

## 2017-02-21 MED ORDER — LIDOCAINE 2% (20 MG/ML) 5 ML SYRINGE
INTRAMUSCULAR | Status: AC
  Filled 2017-02-21: qty 5

## 2017-02-21 MED ORDER — HYDROMORPHONE HCL 2 MG/ML IJ SOLN
INTRAMUSCULAR | Status: AC
  Filled 2017-02-21: qty 1

## 2017-02-21 MED ORDER — CEFAZOLIN SODIUM-DEXTROSE 2-4 GM/100ML-% IV SOLN
2.0000 g | INTRAVENOUS | Status: AC
  Administered 2017-02-21: 2 g via INTRAVENOUS

## 2017-02-21 MED ORDER — LACTATED RINGERS IV SOLN: INTRAVENOUS | Status: DC

## 2017-02-21 MED ORDER — PROMETHAZINE HCL 25 MG/ML IJ SOLN
6.2500 mg | INTRAMUSCULAR | Status: DC | PRN
Start: 2017-02-21 — End: 2017-02-21

## 2017-02-21 MED ORDER — PROPOFOL 10 MG/ML IV BOLUS
INTRAVENOUS | Status: AC
  Filled 2017-02-21: qty 20

## 2017-02-21 MED ORDER — ONDANSETRON HCL 4 MG/2ML IJ SOLN
INTRAMUSCULAR | Status: AC
  Filled 2017-02-21: qty 2

## 2017-02-21 MED ORDER — METHYLENE BLUE 0.5 % INJ SOLN
INTRAVENOUS | Status: AC
  Filled 2017-02-21: qty 10

## 2017-02-21 MED ORDER — SODIUM CHLORIDE 0.9 % IJ SOLN
INTRAMUSCULAR | Status: AC
  Filled 2017-02-21: qty 50

## 2017-02-21 MED ORDER — FENTANYL CITRATE (PF) 250 MCG/5ML IJ SOLN
INTRAMUSCULAR | Status: DC | PRN
  Administered 2017-02-21 (×4): 50 ug via INTRAVENOUS

## 2017-02-21 MED ORDER — KCL IN DEXTROSE-NACL 20-5-0.45 MEQ/L-%-% IV SOLN
INTRAVENOUS | Status: DC
  Administered 2017-02-21: 18:00:00 via INTRAVENOUS
  Filled 2017-02-21: qty 1000

## 2017-02-21 MED ORDER — ONDANSETRON HCL 4 MG/2ML IJ SOLN
INTRAMUSCULAR | Status: DC | PRN
  Administered 2017-02-21: 4 mg via INTRAVENOUS

## 2017-02-21 MED ORDER — METHYLENE BLUE 0.5 % INJ SOLN
INTRAVENOUS | Status: DC | PRN
  Administered 2017-02-21: 5 mL via INTRAVENOUS

## 2017-02-21 MED ORDER — GABAPENTIN 300 MG PO CAPS
600.0000 mg | ORAL_CAPSULE | Freq: Every day | ORAL | Status: AC
  Administered 2017-02-21 – 2017-02-22 (×2): 600 mg via ORAL
  Filled 2017-02-21 (×2): qty 2

## 2017-02-21 MED ORDER — KETOROLAC TROMETHAMINE 15 MG/ML IJ SOLN
15.0000 mg | Freq: Four times a day (QID) | INTRAMUSCULAR | Status: DC
  Administered 2017-02-21: 15 mg via INTRAVENOUS
  Filled 2017-02-21 (×2): qty 1

## 2017-02-21 MED ORDER — SENNOSIDES-DOCUSATE SODIUM 8.6-50 MG PO TABS
2.0000 | ORAL_TABLET | Freq: Every day | ORAL | Status: DC
  Administered 2017-02-22 – 2017-02-23 (×2): 2 via ORAL
  Filled 2017-02-21 (×2): qty 2

## 2017-02-21 MED ORDER — HYDROMORPHONE HCL 1 MG/ML IJ SOLN: 0.2500 mg | INTRAMUSCULAR | Status: DC | PRN

## 2017-02-21 MED ORDER — HYDROMORPHONE HCL 1 MG/ML IJ SOLN
INTRAMUSCULAR | Status: DC | PRN
  Administered 2017-02-21: .4 mg via INTRAVENOUS
  Administered 2017-02-21: .2 mg via INTRAVENOUS
  Administered 2017-02-21: .4 mg via INTRAVENOUS
  Administered 2017-02-21 (×2): .2 mg via INTRAVENOUS

## 2017-02-21 MED ORDER — ROCURONIUM BROMIDE 50 MG/5ML IV SOSY
PREFILLED_SYRINGE | INTRAVENOUS | Status: DC | PRN
  Administered 2017-02-21: 10 mg via INTRAVENOUS
  Administered 2017-02-21: 50 mg via INTRAVENOUS
  Administered 2017-02-21 (×2): 20 mg via INTRAVENOUS

## 2017-02-21 MED ORDER — BUPIVACAINE HCL 0.25 % IJ SOLN
INTRAMUSCULAR | Status: DC | PRN
  Administered 2017-02-21: 20 mL

## 2017-02-21 MED ORDER — ROCURONIUM BROMIDE 50 MG/5ML IV SOSY
PREFILLED_SYRINGE | INTRAVENOUS | Status: AC
Start: 2017-02-21 — End: 2017-02-21
  Filled 2017-02-21: qty 5

## 2017-02-21 MED ORDER — ONDANSETRON HCL 4 MG/2ML IJ SOLN: 4.0000 mg | Freq: Four times a day (QID) | INTRAMUSCULAR | Status: DC | PRN

## 2017-02-21 MED ORDER — LIDOCAINE 2% (20 MG/ML) 5 ML SYRINGE
INTRAMUSCULAR | Status: DC | PRN
  Administered 2017-02-21: 60 mg via INTRAVENOUS

## 2017-02-21 MED ORDER — ROCURONIUM BROMIDE 50 MG/5ML IV SOSY
PREFILLED_SYRINGE | INTRAVENOUS | Status: AC
  Filled 2017-02-21: qty 5

## 2017-02-21 MED ORDER — KETOROLAC TROMETHAMINE 15 MG/ML IJ SOLN
15.0000 mg | Freq: Four times a day (QID) | INTRAMUSCULAR | Status: AC
  Administered 2017-02-21 – 2017-02-22 (×3): 15 mg via INTRAVENOUS
  Filled 2017-02-21 (×3): qty 1

## 2017-02-21 SURGICAL SUPPLY — 54 items
ADH SKN CLS APL DERMABOND .7 (GAUZE/BANDAGES/DRESSINGS) ×1
AGENT HMST KT MTR STRL THRMB (HEMOSTASIS) ×2
ATTRACTOMAT 16X20 MAGNETIC DRP (DRAPES) ×2 IMPLANT
BLADE EXTENDED COATED 6.5IN (ELECTRODE) ×2 IMPLANT
CHLORAPREP W/TINT 26ML (MISCELLANEOUS) ×3 IMPLANT
CLIP TI LARGE 6 (CLIP) ×3 IMPLANT
CLIP TI MEDIUM 6 (CLIP) ×3 IMPLANT
CLIP TI MEDIUM LARGE 6 (CLIP) ×3 IMPLANT
CONT SPEC 4OZ CLIKSEAL STRL BL (MISCELLANEOUS) ×1 IMPLANT
COVER SURGICAL LIGHT HANDLE (MISCELLANEOUS) IMPLANT
DERMABOND ADVANCED (GAUZE/BANDAGES/DRESSINGS) ×1
DERMABOND ADVANCED .7 DNX12 (GAUZE/BANDAGES/DRESSINGS) IMPLANT
DRAPE INCISE IOBAN 66X45 STRL (DRAPES) ×2 IMPLANT
DRAPE WARM FLUID 44X44 (DRAPE) ×2 IMPLANT
DRSG OPSITE POSTOP 4X10 (GAUZE/BANDAGES/DRESSINGS) ×1 IMPLANT
DRSG OPSITE POSTOP 4X12 (GAUZE/BANDAGES/DRESSINGS) IMPLANT
DRSG OPSITE POSTOP 4X8 (GAUZE/BANDAGES/DRESSINGS) IMPLANT
ELECT REM PT RETURN 9FT ADLT (ELECTROSURGICAL) ×2
ELECTRODE REM PT RTRN 9FT ADLT (ELECTROSURGICAL) ×1 IMPLANT
GAUZE SPONGE 4X4 12PLY STRL (GAUZE/BANDAGES/DRESSINGS) ×2 IMPLANT
GAUZE SPONGE 4X4 16PLY XRAY LF (GAUZE/BANDAGES/DRESSINGS) IMPLANT
GLOVE BIO SURGEON STRL SZ 6 (GLOVE) ×5 IMPLANT
GLOVE BIO SURGEON STRL SZ 6.5 (GLOVE) ×2 IMPLANT
GOWN STRL REUS W/ TWL LRG LVL3 (GOWN DISPOSABLE) ×2 IMPLANT
GOWN STRL REUS W/TWL LRG LVL3 (GOWN DISPOSABLE) ×4
HEMOSTAT ARISTA ABSORB 3G PWDR (MISCELLANEOUS) IMPLANT
KIT BASIN OR (CUSTOM PROCEDURE TRAY) ×2 IMPLANT
LIGASURE IMPACT 36 18CM CVD LR (INSTRUMENTS) ×1 IMPLANT
LOOP VESSEL MAXI BLUE (MISCELLANEOUS) ×1 IMPLANT
NEEDLE HYPO 22GX1.5 SAFETY (NEEDLE) ×4 IMPLANT
NS IRRIG 1000ML POUR BTL (IV SOLUTION) ×8 IMPLANT
PACK GENERAL/GYN (CUSTOM PROCEDURE TRAY) ×2 IMPLANT
RETRACTOR WND ALEXIS 25 LRG (MISCELLANEOUS) IMPLANT
RTRCTR WOUND ALEXIS 25CM LRG (MISCELLANEOUS) ×2
SHEET LAVH (DRAPES) ×2 IMPLANT
SPONGE LAP 18X18 X RAY DECT (DISPOSABLE) ×2 IMPLANT
STAPLER VISISTAT 35W (STAPLE) IMPLANT
SURGIFLO W/THROMBIN 8M KIT (HEMOSTASIS) ×2 IMPLANT
SUT MNCRL AB 4-0 PS2 18 (SUTURE) ×3 IMPLANT
SUT PDS AB 1 TP1 96 (SUTURE) ×4 IMPLANT
SUT PROLENE 5 0 CC 1 (SUTURE) IMPLANT
SUT VIC AB 0 CT1 36 (SUTURE) ×16 IMPLANT
SUT VIC AB 2-0 CT1 36 (SUTURE) ×4 IMPLANT
SUT VIC AB 2-0 CT2 27 (SUTURE) ×16 IMPLANT
SUT VIC AB 2-0 SH 27 (SUTURE) ×4
SUT VIC AB 2-0 SH 27X BRD (SUTURE) ×2 IMPLANT
SUT VIC AB 3-0 SH 27 (SUTURE) ×10
SUT VIC AB 3-0 SH 27X BRD (SUTURE) ×1 IMPLANT
SUT VICRYL 2 0 18  UND BR (SUTURE) ×1
SUT VICRYL 2 0 18 UND BR (SUTURE) ×1 IMPLANT
SYR 30ML LL (SYRINGE) ×4 IMPLANT
TOWEL OR 17X26 10 PK STRL BLUE (TOWEL DISPOSABLE) ×4 IMPLANT
TOWEL OR NON WOVEN STRL DISP B (DISPOSABLE) ×2 IMPLANT
TRAY FOLEY W/METER SILVER 16FR (SET/KITS/TRAYS/PACK) ×2 IMPLANT

## 2017-02-21 NOTE — Anesthesia Preprocedure Evaluation (Signed)
Anesthesia Evaluation  Patient identified by MRN, date of birth, ID band Patient awake    Reviewed: Allergy & Precautions, NPO status , Patient's Chart, lab work & pertinent test results  Airway Mallampati: II  TM Distance: >3 FB Neck ROM: Full    Dental no notable dental hx.    Pulmonary neg pulmonary ROS,    Pulmonary exam normal breath sounds clear to auscultation       Cardiovascular negative cardio ROS Normal cardiovascular exam Rhythm:Regular Rate:Normal     Neuro/Psych negative neurological ROS  negative psych ROS   GI/Hepatic Neg liver ROS, GERD  ,  Endo/Other  negative endocrine ROS  Renal/GU negative Renal ROS  negative genitourinary   Musculoskeletal negative musculoskeletal ROS (+)   Abdominal   Peds negative pediatric ROS (+)  Hematology negative hematology ROS (+)   Anesthesia Other Findings   Reproductive/Obstetrics negative OB ROS                             Anesthesia Physical Anesthesia Plan  ASA: II  Anesthesia Plan: General   Post-op Pain Management:    Induction: Intravenous  Airway Management Planned: Oral ETT  Additional Equipment:   Intra-op Plan:   Post-operative Plan: Extubation in OR  Informed Consent: I have reviewed the patients History and Physical, chart, labs and discussed the procedure including the risks, benefits and alternatives for the proposed anesthesia with the patient or authorized representative who has indicated his/her understanding and acceptance.   Dental advisory given  Plan Discussed with: CRNA and Surgeon  Anesthesia Plan Comments:         Anesthesia Quick Evaluation

## 2017-02-21 NOTE — H&P (Signed)
Assessment/Plan:  Ms. MELONY FRANCESCHI  is a 51 y.o.  year old with rapidly enlarging fibroid uterus.  We reviewed options for treatment. Given the size, symptom profile and rapid enlargement, I don't think she is a good candidate for uterine artery embolization. I am recommending hysterectomy (total). I think laparotomy would be most appropriate given the size of the fibroids into the upper abdomen. These are mostly exophytic/subserosal.  Preop biopsy was benign, and features on MRI not consistent with obvious LMS or endometrial cancer. We had a lengthy discussion regarding surgical risk including  bleeding, infection, damage to internal organs (such as bladder,ureters, bowels), blood clot, reoperation and rehospitalization. I discussed that these risks are higher in a patient with bulky fibroids.  Anemia - type and screen.    HPI: Natalyia Dreisbach is a very pleasant 51 year old P3 who is seen in consultation at the request of Dr Stann Mainland for treatment of symptomatic uterine fibroids.  The patient has a 6 month history of progressive mass and bulk symptoms including decreasing bladder capacity. She has also noted menorrhagia. She has a known history of uterine fibroids, however, 6 months previously these were approximately 8cm, and on repeat US on 12/25/16 they were as large as 15cm. Her paps have been normal.  She is otherwise very healthy, though has had 3 prior cesarean sections and no vaginal deliveries.    Current Meds:      Outpatient Encounter Prescriptions as of 01/18/2017  Medication Sig  . Multiple Vitamins-Minerals (MULTIVITAMIN WITH MINERALS) tablet Take 1 tablet by mouth daily.   No facility-administered encounter medications on file as of 01/18/2017.     Allergy: Not on File  Social Hx:   Social History        Social History  . Marital status: Married    Spouse name: N/A  . Number of children: N/A  . Years of education: N/A      Occupational History  .  Not on file.       Social History Main Topics  . Smoking status: Never Smoker  . Smokeless tobacco: Never Used  . Alcohol use No  . Drug use: No  . Sexual activity: Not on file       Other Topics Concern  . Not on file      Social History Narrative  . No narrative on file    Past Surgical Hx: History reviewed. No pertinent surgical history.  Past Medical Hx: History reviewed. No pertinent past medical history.  Past Gynecological History:  C/s x 3. Fibroids No LMP recorded.  Family Hx: History reviewed. No pertinent family history.  Review of Systems:  Constitutional  Feels well,    ENT Normal appearing ears and nares bilaterally Skin/Breast  No rash, sores, jaundice, itching, dryness Cardiovascular  No chest pain, shortness of breath, or edema  Pulmonary  No cough or wheeze.  Gastro Intestinal  No nausea, vomitting, or diarrhoea. No bright red blood per rectum, no abdominal pain, change in bowel movement, or constipation. + abdominal masses Genito Urinary  No frequency, urgency, dysuria, + menorrhagia Musculo Skeletal  No myalgia, arthralgia, joint swelling or pain  Neurologic  No weakness, numbness, change in gait,  Psychology  No depression, anxiety, insomnia.   Vitals:  Blood pressure (!) 141/85, pulse 76, temperature 98.3 F (36.8 C), resp. rate 16, height 5\' 1"  (1.549 m), weight 180 lb 4.8 oz (81.8 kg), SpO2 100 %.  Physical Exam: WD in NAD Neck  Supple NROM,  without any enlargements.  Lymph Node Survey No cervical supraclavicular or inguinal adenopathy Cardiovascular  Pulse normal rate, regularity and rhythm. S1 and S2 normal.  Lungs  Clear to auscultation bilateraly, without wheezes/crackles/rhonchi. Good air movement.  Skin  No rash/lesions/breakdown  Psychiatry  Alert and oriented to person, place, and time  Abdomen  Normoactive bowel sounds, abdomen soft, non-tender and overweight without evidence of hernia. Large bulky mobile  fibroids into upper abdomen below costal margin Back No CVA tenderness Genito Urinary  Vulva/vagina: Normal external female genitalia.   No lesions. No discharge or bleeding.             Bladder/urethra:  No lesions or masses, well supported bladder             Vagina: normal.             Cervix: Normal appearing, no lesions.             Uterus: Bulky, large, extending into upper abdomen, projects anteriorally more than posteriorally.             Adnexa: no discrete masses other than fibroids Rectal  Good tone, no masses no cul de sac nodularity.  Extremities  No bilateral cyanosis, clubbing or edema.   Donaciano Eva, MD

## 2017-02-21 NOTE — Anesthesia Procedure Notes (Signed)
Procedure Name: Intubation Date/Time: 02/21/2017 7:40 AM Performed by: Dione Booze Pre-anesthesia Checklist: Emergency Drugs available, Suction available, Patient being monitored and Patient identified Patient Re-evaluated:Patient Re-evaluated prior to inductionOxygen Delivery Method: Circle system utilized Preoxygenation: Pre-oxygenation with 100% oxygen Intubation Type: IV induction Ventilation: Mask ventilation without difficulty Laryngoscope Size: Mac and 4 Grade View: Grade I Tube type: Oral Tube size: 7.5 mm Number of attempts: 1 Airway Equipment and Method: Stylet Placement Confirmation: ETT inserted through vocal cords under direct vision,  positive ETCO2 and breath sounds checked- equal and bilateral Secured at: 21 cm Tube secured with: Tape Dental Injury: Teeth and Oropharynx as per pre-operative assessment

## 2017-02-21 NOTE — Anesthesia Postprocedure Evaluation (Signed)
Anesthesia Post Note  Patient: [name]  Procedure(s) Performed: Procedure(s) (LRB): ABDOMINAL HYSTERECTOMY FOR UTERUS GREATER THAN 250GMS WITH RIGHT SALPINGECTOMY,   LEFT SALPINGOOPHERECTOMY (Bilateral)  Patient location during evaluation: PACU Anesthesia Type: General Level of consciousness: awake and alert Pain management: pain level controlled Vital Signs Assessment: post-procedure vital signs reviewed and stable Respiratory status: spontaneous breathing, nonlabored ventilation, respiratory function stable and patient connected to nasal cannula oxygen Cardiovascular status: blood pressure returned to baseline and stable Postop Assessment: no signs of nausea or vomiting Anesthetic complications: no       Last Vitals:  Vitals:   02/21/17 1115 02/21/17 1130  BP: (!) 135/94 (!) 138/92  Pulse: 81 83  Resp: 19 18  Temp:      Last Pain:  Vitals:   02/21/17 1130  TempSrc:   PainSc: Asleep                 Emer Onnen S

## 2017-02-21 NOTE — Op Note (Addendum)
PATIENT: Jennifer Landry DATE: 02/21/17   Preop Diagnosis: symptomatic uterine fibroids  Postoperative Diagnosis: same  Surgery: Total abdominal hysterectomy uterus >250gm, left salpingo-oophorectomy, right salpingectomy  Surgeons:  Everitt Amber MD; Lahoma Crocker, MD   Anesthesia: General   Anesthesiologist: Kalman Shan  Estimated blood loss: 1000 ml  IVF:  1100 ml, 1000 Albumin  Urine output: [id number]   Complications: None   Pathology: Uterus, cervix, left and right tubes and left ovary  Operative findings: 40cm fibroid uterus with broad lower uterine segment. Normal appearing right ovary. Left ovary with cyst. Surgically interrupted tubes. Due to the extremely large and bulky size of the uterus, additional time, manipulation and instrumentation was required and blood loss was substantially higher.   Procedure: The patient was identified in the preoperative holding area. Informed consent was signed on the chart. Patient was seen history was reviewed and exam was performed.   The patient was then taken to the operating room and placed in the supine position with SCD hose on. General anesthesia was then induced without difficulty. She was then placed in the dorsolithotomy position. The abdomen was prepped with chlor prep sponges per protocol. Perineum was prepped with Betadine. The vagina was prepped with Betadine a Foley catheter was inserted into the bladder under sterile conditions.  The patient was then draped after the prep was dried. Timeout was performed the patient, procedure, antibiotic, allergy, and length of procedure. A right paramedian vertical  incision was and carried down to the underlying fascia using Bovie cautery. The fascia was scored in the fascial incision was extended superiorly and inferiorly using Bovie cautery. The rectus bellies were dissected off the overlying fascia. The peritoneum was tented and entered. The peritoneal incision was extended  superiorly and inferiorly with visualization of the underlying peritoneal cavity. An Alexis retractor was placed to retract the skin edges.  The right side was first approached. The uterus filled the abdominal and pelvic cavity with minimal visualization laterally. It required maximal efforts to mobilize it away from the right sidewall such that the round ligament could be identified and transected with the bovie to facilitate entry into the retoperitoneum and identification of the ureter in the broad ligament. Due to the lower uterine segment fibroid bulk and the broad nature of the uterus, the right ureter had been displaced anteriorally in the broad ligament and was running in the broad ligament splayed across the side of the uterus. It was identified, 360 degree mobilizated and retracted with a vessel loop for clear identification and protection. This allowed for skeletonization of the right utero-ovarian pedicle which was transected and sealed with the ligasure. The posterior peritoneal leaf was taken down towards the lower uterine segment in the pelvis with care to avoid the ureter thoughout this dissection. Anteriorally the right ureter needed to be completely untunneled from under the right uterine artery which was clamped, cut and suture ligated. THe ureter was skeletonized and mobilized from the broad lower uterine segement all of the way into the bladder on the right.  The anterior leaf of uterine peritoneum was opened and the bladder was carefully dissected from the anterior lower uterine segment and cervix with bovie and blunt dissection. This dissection was carried down anterioraly all of the way along the anterior wall of the vagina to the pubic symphysis due to the broad lower uterine mass and inability to discretely feel the cervix.   On the left the sigmoid colon adhesions to the left IP ligament were  carefully skeletonized. The left utero-ovarian ligament was skeletonized and transected with  sealing by the ligasure. The retroperitoneal space on the left was opened and the pararectal space developed which facillitated identification of the left ureter which was also carefully dissected from the broad ligament and a vessel loop was placed around this for manipulation. The posterior peritoneum was skeletonized and the anterior bladder flap was completed with mobilization of the left bladder pillar from the uterus. The haeney clamps (curved) were placed across the left uterine artery medial to the looped and retracted ureter and the uterine arty was transected, and suture ligated.  Bilaterally successive passes of the straight haeney clamps was performed to clamp, transect and suture ligate the cardinal ligament pedicles.   At the level of the palpable cervix it was apparent that the curved haeney clamps could not be passed to enter the vagina given that the lower uterine segment was so broad bilaterally. Instead, the vagina was grasped with a kocher clamp, elevated, and incised with the bovie. A finger was inserted through the colpotomy on the anterior vaginal wall, and the cervix was felt retrograde. A vertical colpotomy incision was extended from the mid vaginal colpotomy, proximally to the cervicouterine junction. This was extended laterally using the bovie bilaterally to extend the colpotomy to rim around the cervix. At the vaginal cuff corners, retrograde passage of haeney clamps was performed and these pedicles were clamped, cut, and suture ligated and tagged. The posterior colpotomy was completed with the bovie, and the uterine and cervix specimen was handed off.  The bookwalter retractor was placed in the abdomen and the bowel was retracted away. The bookwalter blades were palpated to ensure they did not rest on the muscle bellies of the psoas muscle.  The anterior vertical colpotomy was closed with runing 0-vicry suture in full thickness runing bites. The vaginal cuff was then closed  transversely using figure of 8 interruped sutures. There were areas of bleeding noted around the right vesico-uterine attachments/anterior bladder pillar. The ureter was mobilized laterally, and interrupted and figure of 8 3-0 vicryl sutures were placed to establish hemostasis. Additional hemostasis was achieved in the pelvis (after irrigation) with surgiflow.   The right fallopian tube was separated from the right ovary using the ligasure.   The left ovary contained a cyst and was bleeding from the IP ligament in such a way that meant that hemostasis couldn't be achieved without compromising blood flow to the ovary. It was removed in its entirity with ligasure across the IP ligament that had been skeletonized from the ureter.  The pedicles were noted to be hemostatic. The abdomen pelvis were copiously irrigated. The retractor and laparotomy sponges were removed. The fascia was closed using running mass closure of #1 PDS. The subcutaneous tissues were irrigated and made hemostatic. 20 mL of Exparel within 20 mL of normal saline and 20 mL of marcaine was injected for postoperative pain control. The skin was closed using subcuticular suture.  All instrument, suture, laparotomy, Ray-Tec, and needle counts were correct x2. The patient tolerated the procedure well and was taken recovery room in stable condition. This is Everitt Amber dictating an operative note on Jennifer Landry.  Donaciano Eva, MD

## 2017-02-21 NOTE — Transfer of Care (Signed)
Immediate Anesthesia Transfer of Care Note  Patient: Jennifer Landry  Procedure(s) Performed: Procedure(s): ABDOMINAL HYSTERECTOMY FOR UTERUS GREATER THAN 250GMS WITH RIGHT SALPINGECTOMY,   LEFT SALPINGOOPHERECTOMY (Bilateral)  Patient Location: PACU  Anesthesia Type:General  Level of Consciousness: awake and patient cooperative  Airway & Oxygen Therapy: Patient Spontanous Breathing and Patient connected to face mask oxygen  Post-op Assessment: Report given to RN and Post -op Vital signs reviewed and stable  Post vital signs: Reviewed and stable  Last Vitals:  Vitals:   02/21/17 0543  BP: (!) 141/92  Pulse: 88  Resp: 16  Temp: 37 C    Last Pain:  Vitals:   02/21/17 0543  TempSrc: Oral      Patients Stated Pain Goal: 3 ([id number])  Complications: No apparent anesthesia complications

## 2017-02-22 LAB — BASIC METABOLIC PANEL
Anion gap: 6 (ref 5–15)
BUN: 12 mg/dL (ref 6–20)
CALCIUM: 8.1 mg/dL — AB (ref 8.9–10.3)
CHLORIDE: 104 mmol/L (ref 101–111)
CO2: 28 mmol/L (ref 22–32)
Creatinine, Ser: 0.8 mg/dL (ref 0.44–1.00)
GFR calc non Af Amer: >60 mL/min (ref >60)
Glucose, Bld: 127 mg/dL — ABNORMAL HIGH (ref 65–99)
Potassium: 3.8 mmol/L (ref 3.5–5.1)
SODIUM: 138 mmol/L (ref 135–145)

## 2017-02-22 LAB — CBC
HCT: 29.2 % — ABNORMAL LOW (ref 36.0–46.0)
HEMOGLOBIN: 9 g/dL — AB (ref 12.0–15.0)
MCH: 22.7 pg — ABNORMAL LOW (ref 26.0–34.0)
MCHC: 30.8 g/dL (ref 30.0–36.0)
MCV: 73.7 fL — ABNORMAL LOW (ref 78.0–100.0)
Platelets: 215 10*3/uL (ref 150–400)
RBC: 3.96 MIL/uL (ref 3.87–5.11)
RDW: 17.7 % — ABNORMAL HIGH (ref 11.5–15.5)
WBC: 11.2 10*3/uL — ABNORMAL HIGH (ref 4.0–10.5)

## 2017-02-22 NOTE — Progress Notes (Signed)
1 Day Post-Op Procedure(s) (LRB): ABDOMINAL HYSTERECTOMY FOR UTERUS GREATER THAN 250GMS WITH RIGHT SALPINGECTOMY,   LEFT SALPINGOOPHERECTOMY (Bilateral)  Subjective: Patient reports incisional pain   Objective: Vital signs in last 24 hours: Temp:  [98 F (36.7 C)-98.4 F (36.9 C)] 98.4 F (36.9 C) (02/23 0424) Pulse Rate:  [75-93] 91 (02/23 0424) Resp:  [15-16] 16 (02/23 0424) BP: (121-153)/(77-98) 121/98 (02/23 0424) SpO2:  [96 %-100 %] 97 % (02/23 0424) Last BM Date: 02/21/17  Intake/Output from previous day: 02/22 0701 - 02/23 0700 In: 4335.8 [P.O.:360; I.V.:3475.8; IV Piggyback:500] Out: 3900 [Urine:2700; Blood:1200]  Physical Examination: General: alert Resp: clear to auscultation bilaterally Cardio: regular rate and rhythm, S1, S2 normal, no murmur, click, rub or gallop GI: softly distended Extremities: extremities normal, atraumatic, no cyanosis or edema and Homans sign is negative, no sign of DVT  Labs: WBC/Hgb/Hct/Plts:  11.2/9.0/29.2/215 (02/23 0423) BUN/Cr/glu/ALT/AST/amyl/lip:  12/0.80/--/--/--/--/-- (02/23 0423)   Assessment:  51 y.o. s/p Procedure(s): ABDOMINAL HYSTERECTOMY FOR UTERUS GREATER THAN 250GMS WITH RIGHT SALPINGECTOMY,   LEFT SALPINGOOPHERECTOMY: progressing well Pain:  Pain is well-controlled on oral medications.  Heme: Anemia.  Downward trend in hgb consistent with intraoperative blood loss.  Hemodynamically stable  GI:  Tolerating po: Yes     FEN: Electrolytes in range  Prophylaxis: intermittent pneumatic compression boots.  Plan: Advance diet Encourage ambulation Discontinue IV fluids K pad prn   LOS: 1 day    JACKSON-MOORE,Khalis Hittle A 02/22/2017, 12:25 PM

## 2017-02-23 NOTE — Progress Notes (Signed)
2 Days Post-Op Procedure(s) (LRB): ABDOMINAL HYSTERECTOMY FOR UTERUS GREATER THAN 250GMS WITH RIGHT SALPINGECTOMY,   LEFT SALPINGOOPHERECTOMY (Bilateral)  Subjective: +BM  Objective: Vital signs in last 24 hours: Temp:  [98.3 F (36.8 C)-98.8 F (37.1 C)] 98.3 F (36.8 C) (02/24 0521) Pulse Rate:  [85-87] 85 (02/24 0521) Resp:  [16-18] 16 (02/24 0521) BP: (110-119)/(62-77) 119/77 (02/24 0521) SpO2:  [94 %-98 %] 94 % (02/24 0521) Last BM Date: 02/21/17  Intake/Output from previous day: 02/23 0701 - 02/24 0700 In: 600 [P.O.:600] Out: 1050 [Urine:1050]  Physical Examination: General: alert Resp: clear to auscultation bilaterally Cardio: regular rate and rhythm, S1, S2 normal, no murmur, click, rub or gallop GI: hypoactive BS, softly distended Extremities: extremities normal, atraumatic, no cyanosis or edema and Homans sign is negative, no sign of DVT  Labs:       Assessment:  51 y.o. s/p Procedure(s): ABDOMINAL HYSTERECTOMY FOR UTERUS GREATER THAN 250GMS WITH RIGHT SALPINGECTOMY,   LEFT SALPINGOOPHERECTOMY: progressing well Pain:  Pain is well-controlled on oral medications.  Heme: Anemia. Asymptomatic  GI:  Tolerating po: Yes     Prophylaxis: intermittent pneumatic compression boots.  Plan:  Encourage ambulation K pad prn Anticipate d/c home on 2/25  LOS: 2 days    Landry,Jennifer Walraven A 02/23/2017, 9:20 AM

## 2017-02-24 MED ORDER — OXYCODONE-ACETAMINOPHEN 5-325 MG PO TABS: 1.0000 | ORAL_TABLET | ORAL | 0 refills | Status: DC | PRN

## 2017-02-24 NOTE — Progress Notes (Signed)
Assessment unchanged. Pt and husband verbalized understanding of dc instructions through teach back including meds to resume, activity, follow up care and when to call the doctor. Scripts given as provided by MD. Discharged via wc to front entrance accompanied by NT and husband.

## 2017-02-24 NOTE — Discharge Instructions (Signed)
Return to work: 6 weeks  Activity: 1. Be up and out of the bed during the day.  Take a nap if needed.  You may walk up steps but be careful and use the hand rail.  Stair climbing will tire you more than you think, you may need to stop part way and rest.   2. No lifting or straining for 6 weeks.  3. No driving for 1-2 weeks.  Do Not drive if you are taking narcotic pain medicine.  4. Shower daily.  Use soap and water on your incision and pat dry; don't rub.   5. No sexual activity and nothing in the vagina for 4 weeks.  Diet: 1. Low sodium Heart Healthy Diet is recommended.  2. It is safe to use a laxative if you have difficulty moving your bowels.   Wound Care: 1. Keep clean and dry.  Shower daily.  Reasons to call the Doctor:   Fever - Oral temperature greater than 100.4 degrees Fahrenheit  Foul-smelling vaginal discharge  Difficulty urinating  Nausea and vomiting  Increased pain at the site of the incision that is unrelieved with pain medicine.  Difficulty breathing with or without chest pain  New calf pain especially if only on one side  Sudden, continuing increased vaginal bleeding with or without clots.   Follow-up: 1. See Everitt Amber in 4 weeks.  Contacts: For questions or concerns you should contact:  Dr. Everitt Amber at [phone]  or at [facility] and [place] [facility] [place] [facility] After Surgery In addition to the nursing staff on the unit, the gynecological surgery team will care for you. This team is led by your surgeon and includes a resident in his last year of training, as well as other residents, medical students and a physician assistant or nurse practitioner. There will be a physician in the hospital 24 hours a day to tend to your needs. The residents and students report directly to your surgeon, who is the one overseeing all of your  care.  Pain Relief After Surgery Your pain will be assessed regularly on a scale from 0 to 10. Pain assessment is  necessary to guide your pain relief. It is essential that you are able to take deep breaths, cough and move. Prevention or early treatment of pain is far more effective than trying to treat severe pain. Therefore, we have devised a specialized regimen to stay ahead of your pain and use almost no narcotics, which can slow down your recovery process. If you have an epidural catheter, you will receive a  constant infusion of pain medication through your epidural. If you need additional pain relief, you will be able to push a button to increase the medication in your epidural. You will also be given acetaminophen and an ibuprofen-like medication to keep your pain under control.  You can always ask for additional pain pills if you are not comfortable. In most cases an anesthesiologist with expertise in pain management will visit you every day and help design your pain management plan.  One Day After Surgery Focus on drinking and walking. You will start drinking clear liquids after surgery. The intravenous fluids will be stopped, and the catheter may be removed  from your bladder. We expect you to get out of bed, with the nurses' or assistants' help, sit in a chair for six hours and start to move about  in the hallways. You will also meet with a case manager to assess your discharge needs, including home nursing. Your physician may order home care to assist with your transition home.  Home nursing visits, which are intermittent, help you get readjusted to home by teaching treatments, monitoring medications, and performing clinical assessment and reporting back to your physician. Other services may include therapy and medical equipment; private duty services are also available. If you are going "home" to a different address upon discharge, please alert [place]. A Home Care Coordinator can  visit with you while in the hospital to discuss your options. If you have questions please speak with your case manager. If you need rehabilitation at a facility, a social worker will assist with this. If you need rehabilitation at a facility, a social worker will assist with this. If your procedure was performed in a minimally invasive fashion, you will be discharged to home if your pain is well controlled and you are tolerating a regular diet.     Two Days After Surgery You will start eating a soft diet and change to a more solid diet as you feel up to it. The catheter from your bladder will be removed, if not already done so. If there is a dressing on your wound, it will be removed. The tubing will be disconnected from your IV. We expect you to be out of bed for the majority of the day and walking at least three times in the hallway, with assistance as needed.  You may be discharged at this point if it is felt you are ready.   Three Days After Surgery You continue to eat your low residue diet. You may be ready to go home if you are drinking enough to keep yourself hydrated, your pain is well controlled, you are not belching or nauseated, you are passing gas and you are able to get around on your own. However, we will not discharge you from the hospital until we are sure you are ready.  Discharge Discharge time is at 10 a.m. You will need to make arrangements for someone to accompany you home. You will not be released without someone present. Please keep in mind that we strive to get patients discharged as quickly as possible, but there may be delays for a variety of reasons. Complications That May Delay Discharge: ? Nausea and vomiting: It is very common to feel sick after your surgery. We give you medication to reduce this. However, if you do feel sick, you should reduce the amount you are taking by mouth. Small, frequent meals or drinks are best in this  situation. As long as you can  drink and keep yourself hydrated, the nausea will likely pass.  Ileus: Following surgery, the bowel can be sluggish, making it difficult for food and gas to pass through the intestines. This is called an ileus. We have designed our care program to do everything possible to reduce the likelihood of an ileus. If you do develop an ileus, it usually only lasts two to three days. However, it may require a small tube down the nose to decompress the stomach. The best way to avoid an ileus is to reduce the amount of narcotic pain medications, get up as much as possible after your surgery, and stimulate the bowel early after surgery with small amounts of food and liquids.  Wound infection: If a wound infection develops, this usually happens three to ten days after surgery.    Urinary retention: This  is if you are unable to urinate after the catheter from your bladder is removed. The catheter may need to be reinserted until you are able to urinate on your own. This can be caused by anesthesia, pain medication and decreased activity.    When you are preparing to go home, you will receive:  Detailed discharge instructions, with information about your operation and medications    All prescriptions for medications you need at home; prescriptions can be filled while you are in the hospital if you would like    You may be prescribed Lovenox. Lovenox is used to reduce the risk of developing a blood clot after surgery. An appointment to see your surgeon or provider one to two weeks after you leave the hospital for follow-up   After Discharge Once you are discharged: Call us at any time if you are worried about your recovery or if you should have any questions. During regular office hours, (8:30 a.m.-4:30 p.m.), and after hours call [phone].  Call us immediately if:  You have a fever higher than 100.4 degrees.   Your wound is red, more painful or has drainage.    You are nauseated,  vomiting or can't keep liquids down.    Your pain is worse and not able to be controlled with the regimen you were sent home with.    If you are bleeding heavily or have a lot of fluid coming from your vagina. If you are on narcotics, the goal is to wean you off of them. If you are running low on supply and need more, call the nurse a few days before you will run out.  It is generally easier to reach someone between 8:30 a.m. - 4:30 p.m., so call early if you think something is not right. A nurse or nurse practitioner is available every day to answer your questions. After hours and on the weekends, the calls go to the resident doctors in the hospital. It may take longer for your phone call to be returned during this time. If you have a true emergency, such as severe abdominal pain, chest pain, shortness of breath or any other acute issues, call 911 and go to the local emergency room. Have them contact our team once you are stable.  Concerns After Discharge Bowel Function Following Your Surgery Your bowels will take several weeks to settle down and may be unpredictable at first. Your bowel movements may become loose, or you may be constipated. For the vast number of patients, this will get back to normal with time. Make sure you eat nutritious meals, drink plenty of fluids and take regular walks during the first two weeks after your operation. Your Guide to Gynecologic Surgery    Abdominal Pain It is not unusual to suffer gripping pains (colic) during the first week following removal of a portion of your bowel. This pain usually lasts for a few minutes but goes away between spasms. If you have severe pain lasting more than one to two hours or have a fever and feel generally unwell, you should contact us at  the telephone contact numbers listed at the end of this packet. Hysterectomy: You should have pelvic rest for six (6) weeks or as specified by your doctor after surgery. You  should have nothing in the vagina (no tampons, douching, intercourse, etc.,) during this time period. If you have some vaginal spotting, this is normal. If you have heavy bleeding or  a lot of fluid from your vagina,  this is NOT normal and you should contact your doctor's office or, if after hours, contact the doctor on call.  Diarrhea: Fiber and Imodium (Loperamide) The first step to improving your frequent or loose stools is to bulk up the stool with fiber. Metamucil is the most common type of fiber that is available at any drug store. Start with 1 teaspoon mixed into food, like yogurt or oatmeal, in the morning and evening. Try not to drink any fluid for one hour after you take the fiber. This will allow the fiber to act like a sponge in your intestines, soaking up all the excess water. Continue this for three to five days. You may increase by 1 teaspoon every three to five days until the desired affect, or you are at 1 tablespoon (3 teaspoons) twice a day. If this doesn't work, you may try over-the-counter Loperamide, which is an antidiarrheal medication. You may take one tablet in the morning and evening or 30 minutes before you typically have diarrhea. You may take up to eight of these tablets daily. It is best to discuss this with [place] prior to using this medication. If you have continuous diarrhea and abdominal cramping, call [phone].  Foley Catheter Your surgeon may recommend you be discharged home with a foley catheter (bladder catheter) for 1 to 2 weeks. Typically this recommendation will be made for patients undergoing surgery to the lower urinary tract. Before you leave the hospital, your nurse should outfit you with a clip on the inner thigh to secure the catheter to prevent pulling as well as a small bag that can be easily worn on the upper leg under loose fitting pants and skirts. Your nurse will teach you how to exchange the large bag that typically comes with the catheter  for the small bag. You may find it convenient to attach the small bag when active during the day and then the large bag when sleeping at night. If there is ever a point when you notice the catheter is not draining urine and youbegin to develop pain behind/above the pubic bone, you should report to the clinic or emergency room immediately as the catheter may be kinked or clogged. Kinking or clogging of the catheter prevents urine from draining from your bladder. Urine will quickly build up in the bladder and can cause severe pain as well as seriously disrupt healing if you have undergone surgery on the lower urinary tract. Additionally, pulling on the catheter can result in displacement of the balloon at the end of the catheter from inside of to outside  of the bladder. This also results in severe pain and can cause bleeding. For this reason, secure the catheter to the clip on your inner thigh at all times as the clip prevents against pulling.  Wound Care For the first few weeks following surgery, your wound may be slightly red and uncomfortable. You may shower and let the soapy water wash over your incision. Avoid soaking in the tub for one month following surgery or until the wound is well healed. It will take the wound several months to "soften." It is common to have bumpy areas in the wound near the belly  button and at the ends of the incision.  If you have staples, these should be removed when you are seen by your surgeon at the follow-up appointment. You may have a glue-like material on your incision. Do not pick at this. It will come off over time. It is the surgical  glue used in surgery to close your incision. You also have sutures inside of you that will dissolve over time  Post-Surgery Diet Attention to good nutrition after surgery is important to your recovery. If you had no dietary restrictions prior to the surgery, you will have no special dietary restrictions after the  surgery. However, consuming enough protein, calories, vitamins and minerals is necessary to support healing. Some patients find their appetite is less than normal after surgery. In this case, frequent small meals throughout the day may help. It is not uncommon to lose 10 to 15 pounds after surgery. However, by the fourth to fifth week, your weight loss should stabilize. It is normal that certain foods taste different and certain smells may make you nauseas. Over time, the amount you can comfortably consume will gradually increase. You should try to eat a balanced diet, which includes:  Foods that are soft, moist, and easy to chew and swallow    Canned or soft-cooked fruits and vegetables   Plenty of soft breads, rice, pasta, potatoes and other starchy foods (lowerfiber  varieties may be tolerated better initially)    High-protein foods and beverages, such as meats, eggs, milk, cottage cheese  or a supplemental nutrition drink like Boost or Ensure    Drink plenty of fluids-at least 8 to 10 cups per day. This includes water,  fruit juice, Gatorade, teas/coffee and milk. Drinking plenty is especially important if you have loose stools (diarrhea).   Avoid drinking a lot of caffeine, since this may dehydrate you.    Avoid fried, greasy and highly seasoned or spicy foods.    Avoid carbonated beverages in the first couple weeks.    Avoid raw fruits and vegetables.   Hobbies/Activities Walking is encouraged after your surgery. You should plan to undertake regular exercise several times a day and gradually increase this during the four weeks following your operation until you are back to your normal level of activity. You may climb stairs. Don't do any heavy lifting greater than 10 pounds or contact sports for the first month after your surgery. Generally, you can return to hobbies and activities soon after your surgery. This will help you recover. It can take up to two to three months  to fully recover. It is not unusual to be fatigued and require an afternoon nap for up to six to eight weeks following surgery. Your body is using this energy to heal your wounds. Set small goals for yourself and try to do a little more each day.  Work It is normal to return to work three to six weeks following your operation. If your job involves heavy manual work, then you should wait six weeks. However, you should check with your employer regarding rules, which may be relevant to your return to work. If you need a return-to-work form for your employer or disability papers, bring them to your follow-up appointment or fax them to our office at [phone].  Driving You may drive when you are off narcotics and pain-free enough to react quickly with your braking foot. For most patients, this occurs at one to four weeks following surgery.   Write down any questions you may have to ask your care team.  Important Contact Numbers: GYN Oncology Office: [phone]

## 2017-02-24 NOTE — Discharge Summary (Signed)
Physician Discharge Summary  Patient ID: SYEIRA STAVE MRN: [id number] DOB/AGE: 02/12/1966 51 y.o.  Admit date: 02/21/2017 Discharge date: 02/24/2017  Admission Diagnoses: Uterine leiomyoma  Discharge Diagnoses:  Principal Problem:   Uterine leiomyoma Active Problems:   Anemia   Uterine fibroid   Discharged Condition: good  Hospital Course: On 02/21/2017, the patient underwent the following: Procedure(s): ABDOMINAL HYSTERECTOMY FOR UTERUS GREATER THAN 250GMS WITH RIGHT SALPINGECTOMY,   LEFT SALPINGOOPHERECTOMY.   The postoperative course was uneventful.  A hemoglobin on POD#1 was 9; this was consistent with the intraoperative blood loss.  She remained asymptomatic. She was discharged to home on postoperative day 3 tolerating a regular diet.  Consults: None  Significant Diagnostic Studies: None  Treatments: surgery: see above  Discharge Exam: Blood pressure 129/78, pulse 71, temperature 98.6 F (37 C), temperature source Oral, resp. rate 16, height 5\' 1"  (1.549 m), weight 177 lb (80.3 kg), last menstrual period 01/30/2017, SpO2 98 %. General appearance: alert Resp: clear to auscultation bilaterally Cardio: regular rate and rhythm, S1, S2 normal, no murmur, click, rub or gallop GI: hypoactive BS, softly distended Extremities: extremities normal, atraumatic, no cyanosis or edema and Homans sign is negative, no sign of DVT Incision/Wound:C/D/I  Disposition:   Discharge Instructions    Activity as tolerated - No restrictions    Complete by:  As directed    Call MD for:  extreme fatigue    Complete by:  As directed    Call MD for:  persistant dizziness or light-headedness    Complete by:  As directed    Call MD for:  persistant nausea and vomiting    Complete by:  As directed    Call MD for:  redness, tenderness, or signs of infection (pain, swelling, redness, odor or green/yellow discharge around incision site)    Complete by:  As directed    Call MD for:  severe  uncontrolled pain    Complete by:  As directed    Call MD for:  temperature >100.4    Complete by:  As directed    Diet - low sodium heart healthy    Complete by:  As directed    Diet general    Complete by:  As directed    Discharge wound care:    Complete by:  As directed    Keep clean and dry   Driving Restrictions    Complete by:  As directed    No driving for 1- 2 weeks   Increase activity slowly    Complete by:  As directed    Lifting restrictions    Complete by:  As directed    No lifting > 5 lbs for 6 weeks   May shower / Bathe    Complete by:  As directed    No tub baths for 6 weeks   Sexual Activity Restrictions    Complete by:  As directed    No intercourse for 6 - 8 weeks     Allergies as of 02/24/2017   No Known Allergies     Medication List    TAKE these medications   acetaminophen 500 MG tablet Commonly known as:  TYLENOL Take 1,000 mg by mouth every 6 (six) hours as needed (For pain.).   ferrous sulfate 325 (65 FE) MG tablet Take 325 mg by mouth at bedtime.   ibuprofen 200 MG tablet Commonly known as:  ADVIL,MOTRIN Take 400 mg by mouth every 6 (six) hours as needed (for pain.).  multivitamin with minerals Tabs tablet Take 1 tablet by mouth at bedtime.   oxyCODONE-acetaminophen 5-325 MG tablet Commonly known as:  PERCOCET/ROXICET Take 1-2 tablets by mouth every 4 (four) hours as needed for severe pain.        SignedLahoma Crocker A 02/24/2017, 9:22 AM

## 2017-02-25 LAB — TYPE AND SCREEN
Blood Product Expiration Date: 201803142359
Blood Product Expiration Date: 201803142359
UNIT TYPE AND RH: 7300
UNIT TYPE AND RH: 7300

## 2017-02-26 ENCOUNTER — Telehealth: Payer: Self-pay

## 2017-02-26 NOTE — Telephone Encounter (Signed)
Told Ms Hanan the result of the surgical pathology as noted below by Dr. Denman George. Pt aware of post op follow up for March 21,2018 at 1030.

## 2017-02-26 NOTE — Telephone Encounter (Signed)
-----   Message from Everitt Amber, MD sent at 02/26/2017  1:29 PM EST ----- Dear Jovita Gamma, Would you mind letting her know that her pathology showed benign fibroids, no cancer. Thanks Terrence Dupont

## 2017-03-19 NOTE — Progress Notes (Signed)
Follow-up Note: Gyn-Onc  Consult was initially requested by Dr. Stann Mainland for the evaluation of Jennifer Landry 51 y.o. female  CC:  Chief Complaint  Patient presents with  . Fibroids    Assessment/Plan:  Jennifer Landry  is a 51 y.o.  year old with a history of a fibroid uterus s/p total abdominal hysterectomy, left salpingo-oophorectomy and right salpingectomy on 02/21/17.  She has done very well postop.  Slight vaginal cuff mucosal separation (not dehiscence) at left vaginal cuff - recommended no intercourse for an additional 4 weeks, and to notify me if this bleeds or she has increased discharge.  Follow-up with Dr Stann Mainland annually for well woman care.   HPI: Jennifer Landry is a very pleasant 51 year old P3 who is seen in consultation at the request of Dr Stann Mainland for treatment of symptomatic uterine fibroids.  The patient has a 6 month history of progressive mass and bulk symptoms including decreasing bladder capacity. She has also noted menorrhagia. She has a known history of uterine fibroids, however, 6 months previously these were approximately 8cm, and on repeat US on 12/25/16 they were as large as 15cm. Her paps have been normal.  She is otherwise very healthy, though has had 3 prior cesarean sections and no vaginal deliveries.    Interval Hx:  On 02/21/17 she underwent an ex lap, TAH, LSO and right salpingectomy. Surgery was complicated by blood loss (did not require transfusion). She did well postop and was discharged home on POD 3. Final pathology confirmed benign fibroids. Since surgery she has done well with no complaints.  Current Meds:  Outpatient Encounter Prescriptions as of 03/20/2017  Medication Sig  . acetaminophen (TYLENOL) 500 MG tablet Take 1,000 mg by mouth every 6 (six) hours as needed (For pain.).  Marland Kitchen ferrous sulfate 325 (65 FE) MG tablet Take 325 mg by mouth at bedtime.  Marland Kitchen ibuprofen (ADVIL,MOTRIN) 200 MG tablet Take 400 mg by mouth every 6 (six) hours as needed  (for pain.).  Marland Kitchen Multiple Vitamin (MULTIVITAMIN WITH MINERALS) TABS tablet Take 1 tablet by mouth at bedtime.  Marland Kitchen oxyCODONE-acetaminophen (PERCOCET/ROXICET) 5-325 MG tablet Take 1-2 tablets by mouth every 4 (four) hours as needed for severe pain. (Patient not taking: Reported on 03/20/2017)   No facility-administered encounter medications on file as of 03/20/2017.     Allergy: No Known Allergies  Social Hx:   Social History   Social History  . Marital status: Married    Spouse name: N/A  . Number of children: N/A  . Years of education: N/A   Occupational History  . Not on file.   Social History Main Topics  . Smoking status: Never Smoker  . Smokeless tobacco: Never Used  . Alcohol use No  . Drug use: No  . Sexual activity: Yes   Other Topics Concern  . Not on file   Social History Narrative  . No narrative on file    Past Surgical Hx:  Past Surgical History:  Procedure Laterality Date  . BREAST LUMPECTOMY Right 1989, 2008  . COLPOSCOPY  12/2011  . HYSTERECTOMY ABDOMINAL WITH SALPINGECTOMY Bilateral 02/21/2017   Procedure: ABDOMINAL HYSTERECTOMY FOR UTERUS GREATER THAN 250GMS WITH RIGHT SALPINGECTOMY,   LEFT SALPINGOOPHERECTOMY;  Surgeon: Everitt Amber, MD;  Location: [facility];  Service: Gynecology;  Laterality: Bilateral;    Past Medical Hx:  Past Medical History:  Diagnosis Date  . Anemia   . GERD (gastroesophageal reflux disease)   . Headache  Past Gynecological History:  C/s x 3. Fibroids No LMP recorded. Patient is not currently having periods (Reason: Perimenopausal).  Family Hx: History reviewed. No pertinent family history.  Review of Systems:  Constitutional  Feels well,    ENT Normal appearing ears and nares bilaterally Skin/Breast  No rash, sores, jaundice, itching, dryness Cardiovascular  No chest pain, shortness of breath, or edema  Pulmonary  No cough or wheeze.  Gastro Intestinal  No nausea, vomitting, or diarrhoea. No bright red blood per  rectum, no abdominal pain, change in bowel movement, or constipation. + abdominal masses Genito Urinary  No frequency, urgency, dysuria, + menorrhagia Musculo Skeletal  No myalgia, arthralgia, joint swelling or pain  Neurologic  No weakness, numbness, change in gait,  Psychology  No depression, anxiety, insomnia.   Vitals:  Blood pressure 118/81, pulse 92, temperature 97.9 F (36.6 C), temperature source Oral, resp. rate 18, weight 170 lb (77.1 kg).  Physical Exam: WD in NAD Neck  Supple NROM, without any enlargements.  Lymph Node Survey No cervical supraclavicular or inguinal adenopathy Cardiovascular  Pulse normal rate, regularity and rhythm. S1 and S2 normal.  Lungs  Clear to auscultation bilateraly, without wheezes/crackles/rhonchi. Good air movement.  Skin  No rash/lesions/breakdown  Psychiatry  Alert and oriented to person, place, and time  Abdomen  Normoactive bowel sounds, abdomen soft, non-tender and incision is well healed Back No CVA tenderness Genito Urinary: vaginal cuff in tact, however, there is some palpable weakness in the mucosa on the left - the tissue is in tact with no dehiscence. No bleeding. Rectal  deferred Extremities  No bilateral cyanosis, clubbing or edema.  Donaciano Eva, MD  03/20/2017, 11:18 AM

## 2017-03-20 ENCOUNTER — Ambulatory Visit: Payer: BLUE CROSS/BLUE SHIELD | Attending: Gynecologic Oncology | Admitting: Gynecologic Oncology

## 2017-03-20 ENCOUNTER — Encounter: Payer: Self-pay | Admitting: Gynecologic Oncology

## 2017-03-20 VITALS — BP 118/81 | HR 92 | Temp 97.9°F | Resp 18 | Wt 170.0 lb

## 2017-03-20 DIAGNOSIS — Z90721 Acquired absence of ovaries, unilateral: Secondary | ICD-10-CM | POA: Diagnosis not present

## 2017-03-20 DIAGNOSIS — Z90710 Acquired absence of both cervix and uterus: Secondary | ICD-10-CM

## 2017-03-20 DIAGNOSIS — Z9889 Other specified postprocedural states: Secondary | ICD-10-CM | POA: Diagnosis not present

## 2017-03-20 DIAGNOSIS — Z9079 Acquired absence of other genital organ(s): Secondary | ICD-10-CM | POA: Insufficient documentation

## 2017-03-20 DIAGNOSIS — D259 Leiomyoma of uterus, unspecified: Secondary | ICD-10-CM | POA: Diagnosis not present

## 2017-03-20 DIAGNOSIS — K219 Gastro-esophageal reflux disease without esophagitis: Secondary | ICD-10-CM | POA: Insufficient documentation

## 2017-03-20 DIAGNOSIS — Z09 Encounter for follow-up examination after completed treatment for conditions other than malignant neoplasm: Secondary | ICD-10-CM | POA: Diagnosis not present

## 2017-03-20 DIAGNOSIS — N898 Other specified noninflammatory disorders of vagina: Secondary | ICD-10-CM | POA: Diagnosis not present

## 2017-03-20 NOTE — Patient Instructions (Signed)
Follow up with GYN doctor in a year.  No further follow up needed with Dr. Denman George.

## 2017-04-23 ENCOUNTER — Encounter (HOSPITAL_COMMUNITY): Payer: Self-pay | Admitting: Family Medicine

## 2017-04-23 ENCOUNTER — Ambulatory Visit (HOSPITAL_COMMUNITY)
Admission: EM | Admit: 2017-04-23 | Discharge: 2017-04-23 | Disposition: A | Discharge status: Home / Self Care | Payer: BLUE CROSS/BLUE SHIELD | Attending: Internal Medicine | Admitting: Internal Medicine

## 2017-04-23 DIAGNOSIS — J069 Acute upper respiratory infection, unspecified: Secondary | ICD-10-CM | POA: Diagnosis not present

## 2017-04-23 NOTE — Discharge Instructions (Signed)
You most likely have a viral URI, I advise rest, plenty of fluids and management of symptoms with over the counter medicines. For symptoms you may take Tylenol as needed every 4-6 hours for body aches or fever, not to exceed 4,000 mg a day, Take mucinex or mucinex DM ever 12 hours with a full glass of water, you may use an inhaled steroid such as Flonase, 2 sprays each nostril once a day for congestion, or an antihistamine such as Claritin or Zyrtec once a day. Should your symptoms worsen or fail to resolve, follow up with your primary care provider or return to clinic.

## 2017-04-23 NOTE — ED Triage Notes (Signed)
Pt here for cough, congestion, chills and body aches with low grade fever since Sunday.

## 2017-04-23 NOTE — ED Provider Notes (Signed)
CSN: [id number]     Arrival date & time 04/23/17  1005 History   First MD Initiated Contact with Patient 04/23/17 1050     Chief Complaint  Patient presents with  . URI   (Consider location/radiation/quality/duration/timing/severity/associated sxs/prior Treatment) 51 year old female presents to clinic for "flulike symptoms"   The history is provided by the patient.  URI  Presenting symptoms: congestion, cough, fatigue, fever and rhinorrhea   Presenting symptoms: no sore throat   Congestion:    Location:  Nasal   Interferes with sleep: no     Interferes with eating/drinking: no   Severity:  Moderate Onset quality:  Gradual Duration:  3 days Timing:  Constant Progression:  Unchanged Chronicity:  New Relieved by:  OTC medications Worsened by:  Nothing Associated symptoms: myalgias   Associated symptoms: no headaches, no neck pain, no sinus pain, no sneezing, no swollen glands and no wheezing     Past Medical History:  Diagnosis Date  . Anemia   . GERD (gastroesophageal reflux disease)   . Headache    Past Surgical History:  Procedure Laterality Date  . BREAST LUMPECTOMY Right 1989, 2008  . COLPOSCOPY  12/2011  . HYSTERECTOMY ABDOMINAL WITH SALPINGECTOMY Bilateral 02/21/2017   Procedure: ABDOMINAL HYSTERECTOMY FOR UTERUS GREATER THAN 250GMS WITH RIGHT SALPINGECTOMY,   LEFT SALPINGOOPHERECTOMY;  Surgeon: Everitt Amber, MD;  Location: [facility];  Service: Gynecology;  Laterality: Bilateral;   History reviewed. No pertinent family history. Social History  Substance Use Topics  . Smoking status: Never Smoker  . Smokeless tobacco: Never Used  . Alcohol use No   OB History    No data available     Review of Systems  Constitutional: Positive for appetite change, chills, fatigue and fever.  HENT: Positive for congestion and rhinorrhea. Negative for sinus pain, sneezing and sore throat.   Eyes: Negative.   Respiratory: Positive for cough. Negative for wheezing.    Cardiovascular: Negative for chest pain and palpitations.  Gastrointestinal: Negative for abdominal pain, diarrhea, nausea and vomiting.  Genitourinary: Negative.   Musculoskeletal: Positive for myalgias. Negative for neck pain.  Skin: Negative.   Neurological: Negative for dizziness and headaches.  All other systems reviewed and are negative.   Allergies  Patient has no known allergies.  Home Medications   Prior to Admission medications   Medication Sig Start Date End Date Taking? Authorizing Provider  acetaminophen (TYLENOL) 500 MG tablet Take 1,000 mg by mouth every 6 (six) hours as needed (For pain.).    Historical Provider, MD  ferrous sulfate 325 (65 FE) MG tablet Take 325 mg by mouth at bedtime.    Historical Provider, MD  ibuprofen (ADVIL,MOTRIN) 200 MG tablet Take 400 mg by mouth every 6 (six) hours as needed (for pain.).    Historical Provider, MD  Multiple Vitamin (MULTIVITAMIN WITH MINERALS) TABS tablet Take 1 tablet by mouth at bedtime.    Historical Provider, MD   Meds Ordered and Administered this Visit  Medications - No data to display  BP (!) 148/87   Pulse 95   Temp 99.2 F (37.3 C)   Resp 18   LMP 01/30/2017 (Exact Date)   SpO2 95%  No data found.   Physical Exam  Constitutional: She is oriented to person, place, and time. She appears well-developed and well-nourished. She does not have a sickly appearance. She does not appear ill. No distress.  HENT:  Head: Normocephalic and atraumatic.  Right Ear: Tympanic membrane and external ear normal.  Left Ear:  Tympanic membrane and external ear normal.  Nose: Nose normal. Right sinus exhibits no maxillary sinus tenderness and no frontal sinus tenderness. Left sinus exhibits no maxillary sinus tenderness and no frontal sinus tenderness.  Mouth/Throat: Uvula is midline and oropharynx is clear and moist. No oropharyngeal exudate.  Eyes: Conjunctivae are normal.  Neck: Normal range of motion. Neck supple. No JVD  present.  Cardiovascular: Normal rate and regular rhythm.   Pulmonary/Chest: Effort normal and breath sounds normal. No respiratory distress. She has no wheezes.  Abdominal: Soft. Bowel sounds are normal. She exhibits no distension. There is no tenderness. There is no guarding.  Lymphadenopathy:    She has no cervical adenopathy.  Neurological: She is alert and oriented to person, place, and time.  Skin: Skin is warm and dry. Capillary refill takes less than 2 seconds. She is not diaphoretic.  Psychiatric: She has a normal mood and affect. Her behavior is normal.  Nursing note and vitals reviewed.   Urgent Care Course     Procedures (including critical care time)  Labs Review Labs Reviewed - No data to display  Imaging Review No results found.      MDM   1. Viral upper respiratory tract infection     Counseling provided on over-the-counter therapies for symptom management. Recommend following up with primary care in 1 week or return to clinic if symptoms persist, if symptoms worsen at any time. In the ER.     Barnet Glasgow, NP 04/23/17 1113

## 2017-04-24 ENCOUNTER — Emergency Department (HOSPITAL_COMMUNITY)
Admission: EM | Admit: 2017-04-24 | Discharge: 2017-04-24 | Disposition: A | Discharge status: Home / Self Care | Payer: BLUE CROSS/BLUE SHIELD | Attending: Emergency Medicine | Admitting: Emergency Medicine

## 2017-04-24 ENCOUNTER — Encounter (HOSPITAL_COMMUNITY): Payer: Self-pay | Admitting: Cardiology

## 2017-04-24 ENCOUNTER — Emergency Department (HOSPITAL_COMMUNITY): Payer: BLUE CROSS/BLUE SHIELD

## 2017-04-24 DIAGNOSIS — R509 Fever, unspecified: Secondary | ICD-10-CM | POA: Insufficient documentation

## 2017-04-24 DIAGNOSIS — R51 Headache: Secondary | ICD-10-CM | POA: Insufficient documentation

## 2017-04-24 DIAGNOSIS — R630 Anorexia: Secondary | ICD-10-CM | POA: Insufficient documentation

## 2017-04-24 DIAGNOSIS — R5383 Other fatigue: Secondary | ICD-10-CM | POA: Diagnosis not present

## 2017-04-24 DIAGNOSIS — R05 Cough: Secondary | ICD-10-CM | POA: Insufficient documentation

## 2017-04-24 DIAGNOSIS — R69 Illness, unspecified: Secondary | ICD-10-CM

## 2017-04-24 DIAGNOSIS — J111 Influenza due to unidentified influenza virus with other respiratory manifestations: Secondary | ICD-10-CM

## 2017-04-24 MED ORDER — BENZONATATE 100 MG PO CAPS: 100.0000 mg | ORAL_CAPSULE | Freq: Three times a day (TID) | ORAL | 0 refills | Status: DC

## 2017-04-24 NOTE — ED Provider Notes (Signed)
[place] DEPT Provider Note   CSN: [id number] Arrival date & time: 04/24/17  1219     History   Chief Complaint Chief Complaint  Patient presents with  . Fever    HPI Jennifer Landry is a 51 y.o. female.  HPI   51 year old female presenting c/o Of flulike symptoms. Patient report for the past 4 days she has had fever as high as 100.7, chills, generalized body aches, nonproductive cough, congestion, fatigue, headache and decreased appetite. Did report some mild loose stools. She was seen at urgent care yesterday for her condition and was diagnosed with URI. She is here for a second opinion. Patient voiced concern for the flu. She did not have a flu shot this year. She is not pregnant, no other significant past medical history. She has tried over-the-counter medication with some improvement. No recent travel or sick contact. Patient denies smoking history.   Past Medical History:  Diagnosis Date  . Anemia   . GERD (gastroesophageal reflux disease)   . Headache     Patient Active Problem List   Diagnosis Date Noted  . Anemia 02/21/2017  . Uterine fibroid 02/21/2017  . Uterine leiomyoma 01/18/2017    Past Surgical History:  Procedure Laterality Date  . BREAST LUMPECTOMY Right 1989, 2008  . COLPOSCOPY  12/2011  . HYSTERECTOMY ABDOMINAL WITH SALPINGECTOMY Bilateral 02/21/2017   Procedure: ABDOMINAL HYSTERECTOMY FOR UTERUS GREATER THAN 250GMS WITH RIGHT SALPINGECTOMY,   LEFT SALPINGOOPHERECTOMY;  Surgeon: Everitt Amber, MD;  Location: [facility];  Service: Gynecology;  Laterality: Bilateral;    OB History    No data available       Home Medications    Prior to Admission medications   Medication Sig Start Date End Date Taking? Authorizing Provider  acetaminophen (TYLENOL) 500 MG tablet Take 1,000 mg by mouth every 6 (six) hours as needed (For pain.).    Historical Provider, MD  ferrous sulfate 325 (65 FE) MG tablet Take 325 mg by mouth at bedtime.    Historical  Provider, MD  ibuprofen (ADVIL,MOTRIN) 200 MG tablet Take 400 mg by mouth every 6 (six) hours as needed (for pain.).    Historical Provider, MD  Multiple Vitamin (MULTIVITAMIN WITH MINERALS) TABS tablet Take 1 tablet by mouth at bedtime.    Historical Provider, MD    Family History History reviewed. No pertinent family history.  Social History Social History  Substance Use Topics  . Smoking status: Never Smoker  . Smokeless tobacco: Never Used  . Alcohol use No     Allergies   Patient has no known allergies.   Review of Systems Review of Systems  All other systems reviewed and are negative.    Physical Exam Updated Vital Signs BP 132/89   Pulse 99   Temp 98.8 F (37.1 C)   Resp 16   Ht 5\' 1"  (1.549 m)   Wt 74.8 kg   LMP 01/30/2017 (Exact Date)   SpO2 96%   BMI 31.18 kg/m   Physical Exam  Constitutional: She is oriented to person, place, and time. She appears well-developed and well-nourished. No distress.  HENT:  Head: Atraumatic.  Right Ear: External ear normal.  Left Ear: External ear normal.  Nose: Nose normal.  Mouth/Throat: Oropharynx is clear and moist.  Eyes: Conjunctivae are normal.  Neck: Neck supple.  No nuchal rigidity  Cardiovascular: Normal rate and regular rhythm.   Pulmonary/Chest: Effort normal and breath sounds normal.  Abdominal: Soft. She exhibits no distension. There  is no tenderness.  Neurological: She is alert and oriented to person, place, and time.  Skin: No rash noted.  Psychiatric: She has a normal mood and affect.  Nursing note and vitals reviewed.    ED Treatments / Results  Labs (all labs ordered are listed, but only abnormal results are displayed) Labs Reviewed - No data to display  EKG  EKG Interpretation None       Radiology Dg Chest 2 View  Result Date: 04/24/2017 CLINICAL DATA:  Cough.  Fever. EXAM: CHEST  2 VIEW COMPARISON:  None. FINDINGS: The heart size and mediastinal contours are within normal limits.  Both lungs are clear. No pneumothorax or pleural effusion is noted. The visualized skeletal structures are unremarkable. IMPRESSION: No active cardiopulmonary disease. Electronically Signed   By: Marijo Conception, M.D.   On: 04/24/2017 13:41    Procedures Procedures (including critical care time)  Medications Ordered in ED Medications - No data to display   Initial Impression / Assessment and Plan / ED Course  I have reviewed the triage vital signs and the nursing notes.  Pertinent labs & imaging results that were available during my care of the patient were reviewed by me and considered in my medical decision making (see chart for details).     BP 132/89   Pulse 99   Temp 98.8 F (37.1 C)   Resp 16   Ht 5\' 1"  (1.549 m)   Wt 74.8 kg   LMP 01/30/2017 (Exact Date)   SpO2 96%   BMI 31.18 kg/m    Final Clinical Impressions(s) / ED Diagnoses   Final diagnoses:  Influenza-like illness    New Prescriptions New Prescriptions   BENZONATATE (TESSALON) 100 MG CAPSULE    Take 1 capsule (100 mg total) by mouth every 8 (eight) hours.   Patient with symptoms consistent with influenza.  Vitals are stable, low-grade fever.  No signs of dehydration, tolerating PO's.  Lungs are clear. Due to patient's presentation and physical exam a chest x-ray was not ordered bc likely diagnosis of flu.  Discussed the cost versus benefit of Tamiflu treatment with the patient.  The patient understands that symptoms are greater than the recommended 24-48 hour window of treatment.  Patient will be discharged with instructions to orally hydrate, rest, and use over-the-counter medications such as anti-inflammatories ibuprofen and Aleve for muscle aches and Tylenol for fever.  Patient will also be given a cough suppressant.     Domenic Moras, PA-C 04/24/17 [place], MD 04/25/17 [phone]

## 2017-04-24 NOTE — ED Triage Notes (Signed)
Achy since Sunday.  Fever since Monday. Coughing since Monday.

## 2017-06-03 NOTE — Anesthesia Postprocedure Evaluation (Signed)
Anesthesia Post Note  Patient: Jennifer Landry  Procedure(s) Performed: Procedure(s) (LRB): ABDOMINAL HYSTERECTOMY FOR UTERUS GREATER THAN 250GMS WITH RIGHT SALPINGECTOMY,   LEFT SALPINGOOPHERECTOMY (Bilateral)     Anesthesia Post Evaluation  Last Vitals:  Vitals:   02/23/17 2136 02/24/17 0505  BP: 128/83 129/78  Pulse: 83 71  Resp: 16 16  Temp: 36.9 C 37 C    Last Pain:  Vitals:   02/24/17 0830  TempSrc:   PainSc: 0-No pain                 [name]

## 2017-06-03 NOTE — Addendum Note (Signed)
Addendum  created 06/03/17 1124 by Myrtie Soman, MD   Sign clinical note

## 2017-06-18 IMAGING — DX DG CHEST 2V
2 series · 2 of 2 positions shown · non-contrast
Comparison: None.

CLINICAL DATA: Cough.  Fever.

EXAM:
CHEST  2 VIEW

[chest pa]
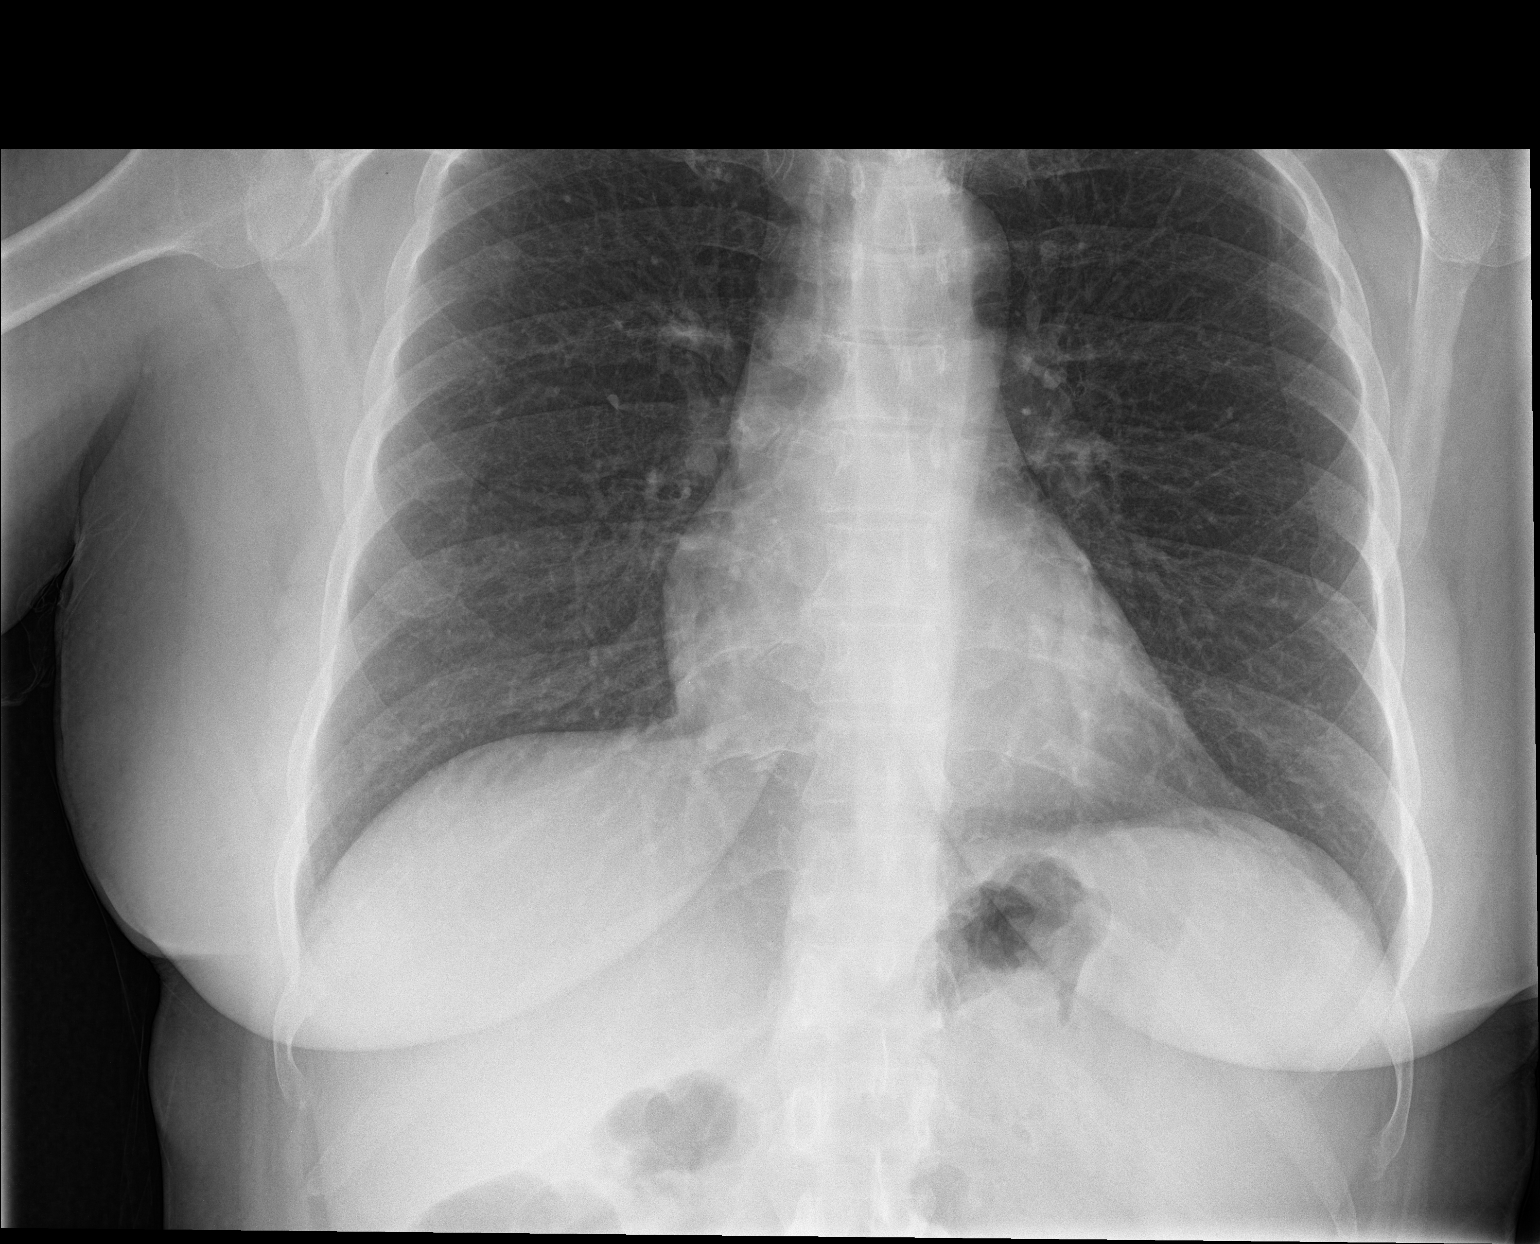

[chest lat]
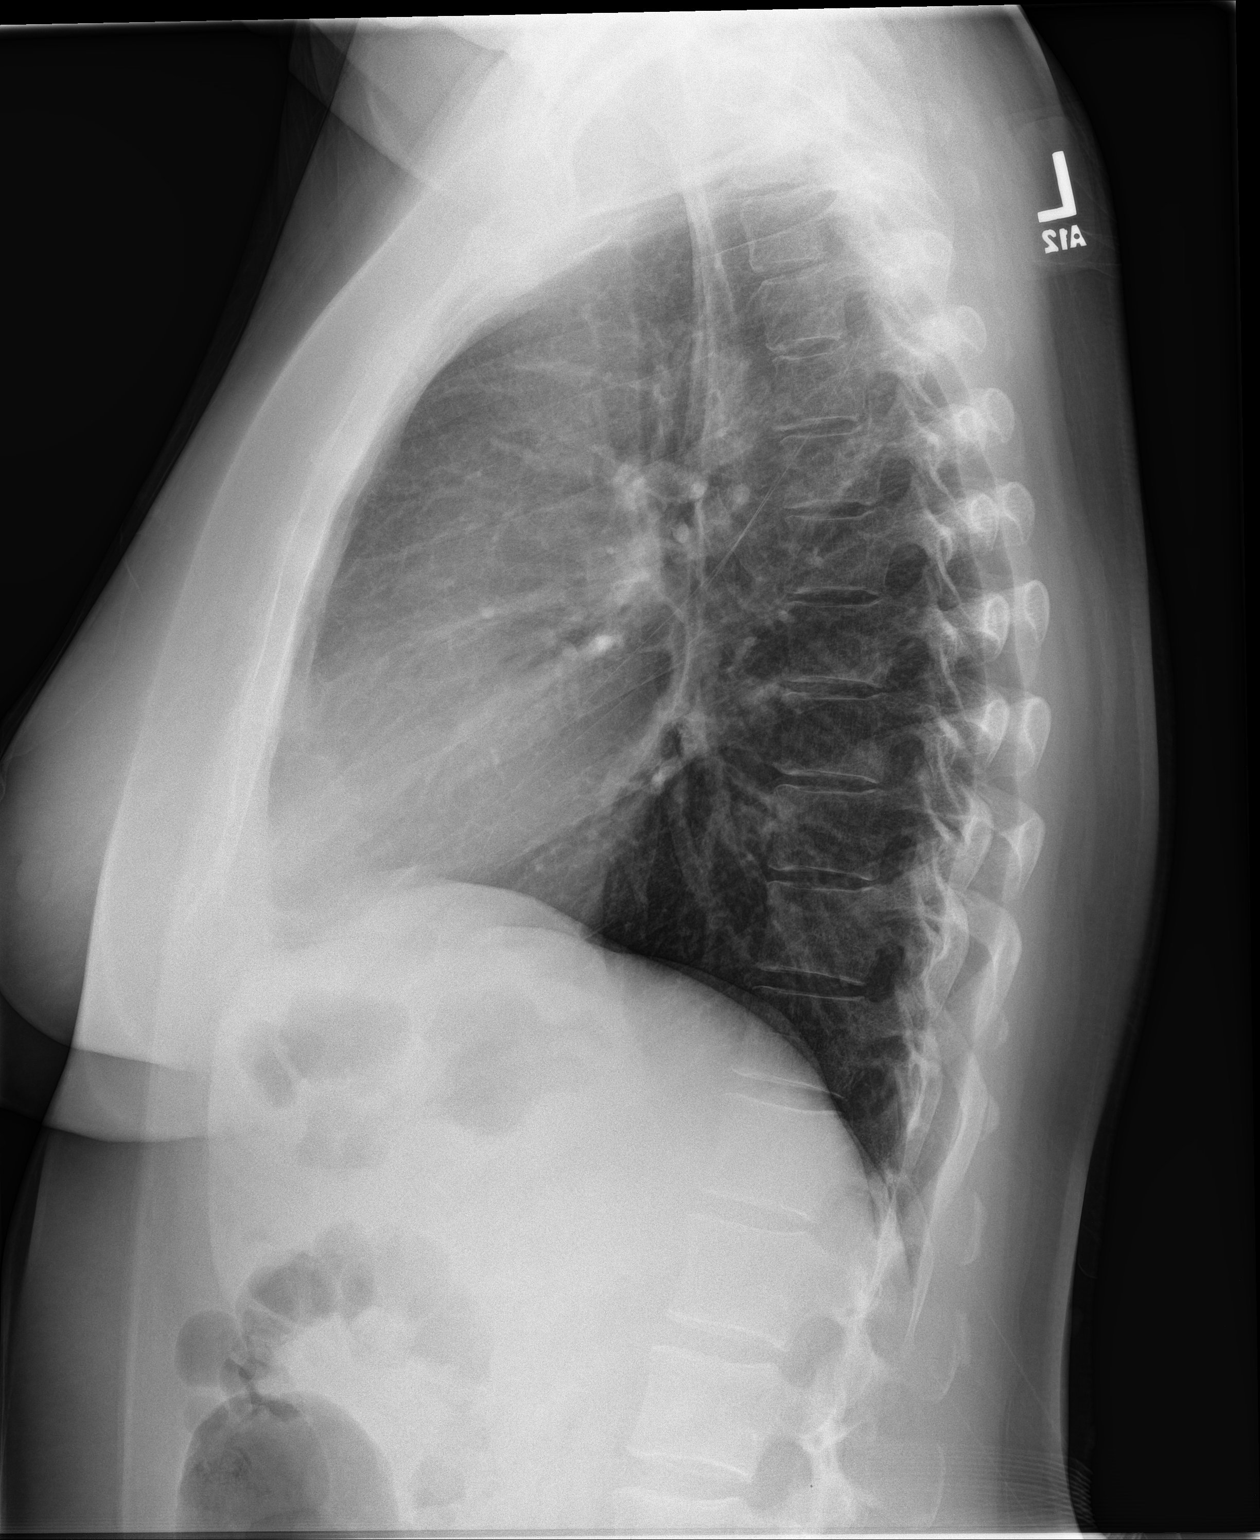

[2 of 2 positions shown; findings below may reference images not displayed]

FINDINGS: The heart size and mediastinal contours are within normal limits.
Both lungs are clear. No pneumothorax or pleural effusion is noted.
The visualized skeletal structures are unremarkable.
IMPRESSION: No active cardiopulmonary disease.

## 2019-10-21 ENCOUNTER — Encounter: Payer: Self-pay | Admitting: Internal Medicine

## 2019-12-11 ENCOUNTER — Ambulatory Visit: Payer: BLUE CROSS/BLUE SHIELD | Admitting: Internal Medicine

## 2019-12-31 ENCOUNTER — Ambulatory Visit: Payer: Self-pay | Attending: Internal Medicine

## 2019-12-31 ENCOUNTER — Other Ambulatory Visit: Payer: Self-pay

## 2019-12-31 DIAGNOSIS — Z20822 Contact with and (suspected) exposure to covid-19: Secondary | ICD-10-CM

## 2019-12-31 DIAGNOSIS — Z20828 Contact with and (suspected) exposure to other viral communicable diseases: Secondary | ICD-10-CM | POA: Insufficient documentation

## 2020-01-02 LAB — NOVEL CORONAVIRUS, NAA: SARS-CoV-2, NAA: NOT DETECTED

## 2020-11-30 DIAGNOSIS — U071 COVID-19: Secondary | ICD-10-CM

## 2020-11-30 HISTORY — DX: COVID-19: U07.1

## 2021-01-09 ENCOUNTER — Ambulatory Visit: Payer: BC Managed Care – PPO | Admitting: Internal Medicine

## 2021-01-09 ENCOUNTER — Encounter: Payer: Self-pay | Admitting: Internal Medicine

## 2021-01-09 VITALS — BP 148/88 | HR 94 | Ht 61.0 in | Wt 178.8 lb

## 2021-01-09 DIAGNOSIS — E6609 Other obesity due to excess calories: Secondary | ICD-10-CM | POA: Diagnosis not present

## 2021-01-09 DIAGNOSIS — Z1211 Encounter for screening for malignant neoplasm of colon: Secondary | ICD-10-CM

## 2021-01-09 DIAGNOSIS — K582 Mixed irritable bowel syndrome: Secondary | ICD-10-CM

## 2021-01-09 DIAGNOSIS — E66811 Obesity, class 1: Secondary | ICD-10-CM

## 2021-01-09 DIAGNOSIS — K432 Incisional hernia without obstruction or gangrene: Secondary | ICD-10-CM | POA: Diagnosis not present

## 2021-01-09 DIAGNOSIS — Z6833 Body mass index (BMI) 33.0-33.9, adult: Secondary | ICD-10-CM

## 2021-01-09 MED ORDER — PEG-KCL-NACL-NASULF-NA ASC-C 100 G PO SOLR: 1.0000 | Freq: Once | ORAL | 0 refills | Status: AC

## 2021-01-09 NOTE — Progress Notes (Signed)
Jennifer Landry 55 y.o. 1966/11/20 [id number]  Assessment & Plan:   Encounter Diagnoses  Name Primary?  . Irritable bowel syndrome with both constipation and diarrhea Yes  . Colon cancer screening   . Class 1 obesity due to excess calories without serious comorbidity with body mass index (BMI) of 33.0 to 33.9 in adult   . Incisional hernia, without obstruction or gangrene    I think she does have IBS and tolerates it well without significant quality of life disturbance.  A screening colonoscopy is appropriate.  She has a little bit of anticipatory anxiety about this but we talked it through when she is ready to perform this. The risks and benefits as well as alternatives of endoscopic procedure(s) have been discussed and reviewed. All questions answered. The patient agrees to proceed.  Assess hemorrhoids at time of colonoscopy  We had a good discussion about the need for weight loss and changing eating habits and particularly losing abdominal weight to try to improve her health and reduce future health risks.  I have suggested the following:  Read The Obesity Code by Dr. Sharman Cheek and implement his suggestions. Investigate and sign up for the dietdoctor website if desired and utilize those resources Handouts on insulin resistance and proper food choices to reduce that and the use of restricted feeding provided to the patient. Had a long-term approach to this and do not expect rapid results but have a 1 to 2-year timeframe to change her eating and to become fat adapted.  Monitor the incisional hernia.  Weight loss will help this.  I appreciate the opportunity to care for this patient.    Subjective:   Chief Complaint: Colon cancer screening  HPI 55 year old white woman self-referred and here to discuss colonoscopy because of her current age and some family history of second-degree relatives with colon cancer.  She has a lifelong history of mild alternating bowel habits  consistent with IBS which is self diagnosed.  Moves her bowels daily but sometimes they are loose.  There is no significant quality of life issue here.  She does feel like increasing carbohydrates will cause more diarrhea she has not noticed any lactose intolerance let us but not spinach will cause diarrhea and she cannot eat cabbage and many other cruciferous vegetables without having a lot of gas and some loose stools.  More recently she began drinking vital protein with collagen supplement and has noticed that has formed upper bowel movement somewhat.  Occasionally she will have a slight mucus discharge.  Again these IBS symptoms are chronic and stable.  She came to see me because her mother Jennifer Landry is my patient.  I had prescribed IBgard for her mom and sometimes when the patient here has some abdominal discomfort or bloating she will use that and it helps significantly.  There has been rare rectal bleeding with wiping only.  She associates this with prolapsed or swollen hemorrhoids which have been a problem ever since childbirth extremely rare.  Occasional heartburn treated with over-the-counter antacids  As far as diet she does have a fair amount of added sugar drinks like sweet iced tea.  She is interested in losing weight.  Maternal grandmother had colon cancer.  The patient's mother has IBS and history of benign gastric polyps.   GI review of systems is otherwise negative. No Known Allergies Current Meds  Medication Sig  . acetaminophen (TYLENOL) 500 MG tablet Take 1,000 mg by mouth every 6 (six) hours as needed (  For pain.).  Marland Kitchen Cholecalciferol (D3-1000 PO) Take 1 tablet by mouth daily.  . Cyanocobalamin (VITAMIN B 12 PO) Take 1 tablet by mouth daily.  Marland Kitchen ibuprofen (ADVIL,MOTRIN) 200 MG tablet Take 400 mg by mouth every 6 (six) hours as needed (for pain.).  Marland Kitchen Multiple Vitamin (MULTIVITAMIN WITH MINERALS) TABS tablet Take 1 tablet by mouth at bedtime.  Marland Kitchen Peppermint Oil (IBGARD PO)  Take 2 tablets by mouth daily.  . Zinc Sulfate (ZINC 15 PO) Take 1 tablet by mouth daily.   Past Medical History:  Diagnosis Date  . Anemia   . COVID-19 11/2020  . GERD (gastroesophageal reflux disease)   . Headache    Past Surgical History:  Procedure Laterality Date  . BREAST LUMPECTOMY Right 1989, 2008  . COLPOSCOPY  12/2011  . HYSTERECTOMY ABDOMINAL WITH SALPINGECTOMY Bilateral 02/21/2017   Procedure: ABDOMINAL HYSTERECTOMY FOR UTERUS GREATER THAN 250GMS WITH RIGHT SALPINGECTOMY,   LEFT SALPINGOOPHERECTOMY;  Surgeon: Everitt Amber, MD;  Location: [facility];  Service: Gynecology;  Laterality: Bilateral;   Social History   Social History Narrative  . Not on file   family history includes Colon cancer in her maternal grandmother; Diabetes in her father, paternal grandfather, and paternal grandmother; Heart disease in her brother, brother, father, mother, paternal grandfather, and paternal grandmother; Other in her maternal grandfather; Parkinson's disease in her father.   Review of Systems  As per HPI.  All other review of systems are negative. Objective:   Physical Exam BP (!) 148/88   Pulse 94   Ht 5\' 1"  (1.549 m)   Wt 178 lb 12.8 oz (81.1 kg)   LMP 01/30/2017 (Exact Date)   SpO2 98%   BMI 33.78 kg/m  NAD obese anicterid Lungs cta'cor Nl abd obese, soft w/ low midline scar and small incisional hernia BS + no HSM/mass Rectal -deferred A and o x 3

## 2021-01-09 NOTE — Patient Instructions (Addendum)
You have been scheduled for a colonoscopy. Please follow written instructions given to you at your visit today.  Please pick up your prep supplies at the pharmacy within the next 1-3 days. If you use inhalers (even only as needed), please bring them with you on the day of your procedure.  Due to recent changes in healthcare laws, you may see the results of your imaging and laboratory studies on MyChart before your provider has had a chance to review them.  We understand that in some cases there may be results that are confusing or concerning to you. Not all laboratory results come back in the same time frame and the provider may be waiting for multiple results in order to interpret others.  Please give [place] 48 hours in order for your provider to thoroughly review all the results before contacting the office for clarification of your results.   Lower carbohydrate with restricted feeding and intermittent fasting are great ways to lose weight and feel better.  A great book is The Obesity Code by Dr. Sharman Cheek - please purchase and read  [employer]iet[employer] is a wonderful website to guide you to do lower carb and restricted feeding time and intermittent fasting. I suggest you join this or at least try a free trial membership to learn more and try to lose weight.  Remember - have to lower the insulin levels by changing what you eat and when.  I appreciate the opportunity to care for you. Gatha Mayer, MD, [facility]

## 2021-03-08 ENCOUNTER — Other Ambulatory Visit: Payer: Self-pay | Admitting: Internal Medicine

## 2021-03-09 LAB — SARS CORONAVIRUS 2 (TAT 6-24 HRS): SARS Coronavirus 2: NEGATIVE

## 2021-03-10 ENCOUNTER — Ambulatory Visit (AMBULATORY_SURGERY_CENTER): Payer: BC Managed Care – PPO | Admitting: Internal Medicine

## 2021-03-10 ENCOUNTER — Encounter: Payer: Self-pay | Admitting: Internal Medicine

## 2021-03-10 ENCOUNTER — Other Ambulatory Visit: Payer: Self-pay | Admitting: Internal Medicine

## 2021-03-10 ENCOUNTER — Other Ambulatory Visit: Payer: Self-pay

## 2021-03-10 VITALS — BP 153/96 | HR 68 | Temp 97.5°F | Resp 15 | Ht 61.0 in | Wt 178.0 lb

## 2021-03-10 DIAGNOSIS — Z1211 Encounter for screening for malignant neoplasm of colon: Secondary | ICD-10-CM | POA: Diagnosis not present

## 2021-03-10 DIAGNOSIS — R030 Elevated blood-pressure reading, without diagnosis of hypertension: Secondary | ICD-10-CM

## 2021-03-10 DIAGNOSIS — D125 Benign neoplasm of sigmoid colon: Secondary | ICD-10-CM | POA: Diagnosis not present

## 2021-03-10 DIAGNOSIS — K648 Other hemorrhoids: Secondary | ICD-10-CM

## 2021-03-10 HISTORY — PX: COLONOSCOPY: SHX174

## 2021-03-10 MED ORDER — HYDROCORTISONE (PERIANAL) 2.5 % EX CREA: 1.0000 "application " | TOPICAL_CREAM | Freq: Two times a day (BID) | CUTANEOUS | 0 refills | Status: DC

## 2021-03-10 MED ORDER — SODIUM CHLORIDE 0.9 % IV SOLN: 500.0000 mL | Freq: Once | INTRAVENOUS | Status: DC

## 2021-03-10 NOTE — Progress Notes (Signed)
Pt. Kept in RR until more air expelled, and pt. More comfortable for ride home.  Pt. Has been easily expelling air during recovery.  Given instructions to facilitate air expulsion at home.  Care partner present.  Pt. Wanted to lay longer on her left side to get more air out prior to discharge.

## 2021-03-10 NOTE — Progress Notes (Signed)
Report given to PACU, vss 

## 2021-03-10 NOTE — Patient Instructions (Addendum)
It's good you had this - I removed a large polyp today. Looks benign. I will let you know pathology results and when to have another routine colonoscopy by mail and/or My Chart.  I marked the spot of removal in case we need to go back to check it.  You also have a condition called diverticulosis - common and not usually a problem. Please read the handout provided.  As I am sure you also know your hemorrhoids were swollen and irritated. I have prescribed some cream to help these shrink.  Your blood pressure was also elevated as we knew, too. You probably have high blood pressure - maybe not, but you need to find a primary care health provider and be checked. You can also buy a home blood pressure cuff and check it.  I appreciate the opportunity to care for you. Gatha Mayer, MD, [facility]  Hemorrhoid, diverticulosis, and hemorrhoid handouts given to patient.  Resume previous diet. Continue present medications.  Repeat colonoscopy recommended for surveillance.  Date to be determined after pathology results reviewed.  No aspirin, ibuprofen, naproxen, or other NSAID drugs for 2 weeks.  YOU HAD AN ENDOSCOPIC PROCEDURE TODAY AT [facility]:   Refer to the procedure report that was given to you for any specific questions about what was found during the examination.  If the procedure report does not answer your questions, please call your gastroenterologist to clarify.  If you requested that your care partner not be given the details of your procedure findings, then the procedure report has been included in a sealed envelope for you to review at your convenience later.  YOU SHOULD EXPECT: Some feelings of bloating in the abdomen. Passage of more gas than usual.  Walking can help get rid of the air that was put into your GI tract during the procedure and reduce the bloating. If you had a lower endoscopy (such as a colonoscopy or flexible sigmoidoscopy) you may notice spotting of blood  in your stool or on the toilet paper. If you underwent a bowel prep for your procedure, you may not have a normal bowel movement for a few days.  Please Note:  You might notice some irritation and congestion in your nose or some drainage.  This is from the oxygen used during your procedure.  There is no need for concern and it should clear up in a day or so.  SYMPTOMS TO REPORT IMMEDIATELY:   Following lower endoscopy (colonoscopy or flexible sigmoidoscopy):  Excessive amounts of blood in the stool  Significant tenderness or worsening of abdominal pains  Swelling of the abdomen that is new, acute  Fever of 100F or higher  For urgent or emergent issues, a gastroenterologist can be reached at any hour by calling [phone]. Do not use MyChart messaging for urgent concerns.    DIET:  We do recommend a small meal at first, but then you may proceed to your regular diet.  Drink plenty of fluids but you should avoid alcoholic beverages for 24 hours.  ACTIVITY:  You should plan to take it easy for the rest of today and you should NOT DRIVE or use heavy machinery until tomorrow (because of the sedation medicines used during the test).    FOLLOW UP: Our staff will call the number listed on your records 48-72 hours following your procedure to check on you and address any questions or concerns that you may have regarding the information given to you following your procedure.  If we do not reach you, we will leave a message.  We will attempt to reach you two times.  During this call, we will ask if you have developed any symptoms of COVID 19. If you develop any symptoms (ie: fever, flu-like symptoms, shortness of breath, cough etc.) before then, please call [phone].  If you test positive for Covid 19 in the 2 weeks post procedure, please call and report this information to [place].    If any biopsies were taken you will be contacted by phone or by letter within the next 1-3 weeks.  Please call us at  [phone] if you have not heard about the biopsies in 3 weeks.    SIGNATURES/CONFIDENTIALITY: You and/or your care partner have signed paperwork which will be entered into your electronic medical record.  These signatures attest to the fact that that the information above on your After Visit Summary has been reviewed and is understood.  Full responsibility of the confidentiality of this discharge information lies with you and/or your care-partner.

## 2021-03-10 NOTE — Progress Notes (Signed)
BP   149/104, Labetalol given IV, MD update, vss

## 2021-03-10 NOTE — Op Note (Signed)
[facility] Patient Name: Jennifer Landry Procedure Date: 03/10/2021 7:12 AM MRN: [id number] Endoscopist: Gatha Mayer , MD Age: 55 Referring MD:  Date of Birth: 03-01-1966 Gender: Female Account #: [id number] Procedure:                Colonoscopy Indications:              Screening for colorectal malignant neoplasm Medicines:                Propofol per Anesthesia, Monitored Anesthesia Care Procedure:                Pre-Anesthesia Assessment:                           - Prior to the procedure, a History and Physical                            was performed, and patient medications and                            allergies were reviewed. The patient's tolerance of                            previous anesthesia was also reviewed. The risks                            and benefits of the procedure and the sedation                            options and risks were discussed with the patient.                            All questions were answered, and informed consent                            was obtained. Prior Anticoagulants: The patient has                            taken no previous anticoagulant or antiplatelet                            agents. ASA Grade Assessment: II - A patient with                            mild systemic disease. After reviewing the risks                            and benefits, the patient was deemed in                            satisfactory condition to undergo the procedure.                           After obtaining informed consent, the colonoscope  was passed under direct vision. Throughout the                            procedure, the patient's blood pressure, pulse, and                            oxygen saturations were monitored continuously. The                            Olympus PCF-H190DL ([id number]) Colonoscope was                            introduced through the anus and advanced to the the                             cecum, identified by appendiceal orifice and                            ileocecal valve. The colonoscopy was performed                            without difficulty. The patient tolerated the                            procedure well. The quality of the bowel                            preparation was good. The bowel preparation used                            was MoviPrep via split dose instruction. The                            ileocecal valve, appendiceal orifice, and rectum                            were photographed. Scope In: 8:02:15 AM Scope Out: 8:27:19 AM Scope Withdrawal Time: 0 hours 21 minutes 12 seconds  Total Procedure Duration: 0 hours 25 minutes 4 seconds  Findings:                 The perianal exam findings include hemorrhoids.                           A 15 to 20 mm polyp was found in the distal sigmoid                            colon. The polyp was pedunculated. An endoloop was                            maneuvered over the polyp stalk and closed at the                            mucosal attachment prior to removal in order  to                            prevent bleeding. The polyp was removed with a                            piecemeal technique using a hot snare. Resection                            and retrieval were complete. Verification of                            patient identification for the specimen was done.                            Estimated blood loss was minimal. Area was tattooed                            with an injection of 3 mL of Spot (carbon black).                            Estimated blood loss: none.                           Multiple diverticula were found in the sigmoid                            colon.                           External and internal hemorrhoids were found. The                            hemorrhoids were large.                           The exam was otherwise without abnormality on                             direct and retroflexion views. Complications:            No immediate complications. Estimated Blood Loss:     Estimated blood loss was minimal. Impression:               - Hemorrhoids found on perianal exam.                           - One 15 to 20 mm polyp in the distal sigmoid                            colon, removed piecemeal using a hot snare.                            Resected and retrieved. Tattooed.                           -  Diverticulosis in the sigmoid colon.                           - External and internal hemorrhoids.                           - The examination was otherwise normal on direct                            and retroflexion views. Recommendation:           - Patient has a contact number available for                            emergencies. The signs and symptoms of potential                            delayed complications were discussed with the                            patient. Return to normal activities tomorrow.                            Written discharge instructions were provided to the                            patient.                           - Resume previous diet.                           - Continue present medications.                           - Repeat colonoscopy is recommended for                            surveillance. The colonoscopy date will be                            determined after pathology results from today's                            exam become available for review.                           - No aspirin, ibuprofen, naproxen, or other                            non-steroidal anti-inflammatory drugs for 2 weeks                            after polyp removal.                           - Check BP at home, obtain PCP  as she had sig                            elevation BP pre and during - may have hypertension Gatha Mayer, MD 03/10/2021 8:41:48 AM This report has been signed electronically.

## 2021-03-10 NOTE — Progress Notes (Signed)
VSPollyann Kennedy RN  745- BP rechecked 181/112- Elizabeth Palau CRNA is aware and ok to proceed

## 2021-03-10 NOTE — Progress Notes (Signed)
Called to room to assist during endoscopic procedure.  Patient ID and intended procedure confirmed with present staff. Received instructions for my participation in the procedure from the performing physician.  

## 2021-03-15 ENCOUNTER — Telehealth: Payer: Self-pay

## 2021-03-15 ENCOUNTER — Telehealth: Payer: Self-pay | Admitting: *Deleted

## 2021-03-15 NOTE — Telephone Encounter (Signed)
No answer for post procedure call back. Unable to leave message. 

## 2021-03-15 NOTE — Telephone Encounter (Signed)
No answer, left message to call back later today, B.Marena Witts RN. 

## 2021-03-24 ENCOUNTER — Encounter: Payer: Self-pay | Admitting: Internal Medicine

## 2021-03-24 DIAGNOSIS — Z86010 Personal history of colonic polyps: Secondary | ICD-10-CM

## 2021-03-24 DIAGNOSIS — Z860101 Personal history of adenomatous and serrated colon polyps: Secondary | ICD-10-CM | POA: Insufficient documentation

## 2021-03-24 HISTORY — DX: Personal history of colonic polyps: Z86.010

## 2021-03-24 HISTORY — DX: Personal history of adenomatous and serrated colon polyps: Z86.0101

## 2021-04-10 ENCOUNTER — Telehealth: Payer: Self-pay | Admitting: Internal Medicine

## 2021-04-10 NOTE — Telephone Encounter (Signed)
We do have openings for July at this point.  The schedule opened up recently.  So she can schedule her sigmoidoscopy direct for July now.

## 2021-04-10 NOTE — Telephone Encounter (Addendum)
Inbound call from patient wanting to schedule her procedure that Dr. Carlean Purl recommended.  Informed patient no availability for June or July.  Wants to make sure ok to wait since he stated for her to have it within 3 months from her colonoscopy.  Please advise.

## 2021-04-11 NOTE — Telephone Encounter (Signed)
Thank you Dr. Carlean Purl.  Called patient and left voice to call and schedule her procedure.

## 2021-04-13 NOTE — Telephone Encounter (Signed)
Patient called back and scheduled for 07/07/21 and 07/21/21.

## 2021-07-07 ENCOUNTER — Ambulatory Visit (AMBULATORY_SURGERY_CENTER): Payer: BC Managed Care – PPO | Admitting: *Deleted

## 2021-07-07 ENCOUNTER — Other Ambulatory Visit: Payer: Self-pay

## 2021-07-07 VITALS — Ht 61.0 in | Wt 180.0 lb

## 2021-07-07 DIAGNOSIS — Z86010 Personal history of colonic polyps: Secondary | ICD-10-CM

## 2021-07-07 NOTE — Progress Notes (Addendum)
Pt verified name, DOB, address and insurance during PV today. Pt mailed instruction packet to included paper to complete and mail back to [facility] with addressed and stamped envelope, Emmi video, copy of consent form to read and not return, and instructions.PV completed over the phone. Pt encouraged to call with questions or issues. My Chart instructions to pt as well    No egg or soy allergy known to patient  No issues with past sedation with any surgeries or procedures Patient denies ever being told they had issues or difficulty with intubation  No FH of Malignant Hyperthermia No diet pills per patient No home 02 use per patient  No blood thinners per patient  Pt denies issues with constipation  No A fib or A flutter  EMMI video to pt or via [place] 19 guidelines implemented in [place] today with Pt and RN  Pt is not  vaccinated  for Covid   Due to the COVID-19 pandemic we are asking patients to follow certain guidelines.  Pt aware of COVID protocols and [facility] guidelines

## 2021-07-20 ENCOUNTER — Encounter: Payer: Self-pay | Admitting: Certified Registered Nurse Anesthetist

## 2021-07-21 ENCOUNTER — Encounter: Payer: Self-pay | Admitting: Internal Medicine

## 2021-07-21 ENCOUNTER — Ambulatory Visit (AMBULATORY_SURGERY_CENTER): Payer: BC Managed Care – PPO | Admitting: Internal Medicine

## 2021-07-21 ENCOUNTER — Other Ambulatory Visit: Payer: Self-pay

## 2021-07-21 VITALS — BP 130/100 | HR 86 | Temp 98.0°F | Resp 19 | Ht 61.0 in | Wt 178.0 lb

## 2021-07-21 DIAGNOSIS — Z86010 Personal history of colonic polyps: Secondary | ICD-10-CM | POA: Diagnosis not present

## 2021-07-21 DIAGNOSIS — K5730 Diverticulosis of large intestine without perforation or abscess without bleeding: Secondary | ICD-10-CM | POA: Diagnosis not present

## 2021-07-21 DIAGNOSIS — K644 Residual hemorrhoidal skin tags: Secondary | ICD-10-CM | POA: Diagnosis not present

## 2021-07-21 DIAGNOSIS — D125 Benign neoplasm of sigmoid colon: Secondary | ICD-10-CM

## 2021-07-21 DIAGNOSIS — K648 Other hemorrhoids: Secondary | ICD-10-CM | POA: Diagnosis not present

## 2021-07-21 MED ORDER — SODIUM CHLORIDE 0.9 % IV SOLN: 500.0000 mL | Freq: Once | INTRAVENOUS | Status: DC

## 2021-07-21 MED ORDER — HYDROCORTISONE (PERIANAL) 2.5 % EX CREA: 1.0000 "application " | TOPICAL_CREAM | Freq: Two times a day (BID) | CUTANEOUS | 0 refills | Status: AC

## 2021-07-21 NOTE — Progress Notes (Signed)
Pt's states no medical or surgical changes since previsit or office visit.   V/s-cw  Check-in-aer

## 2021-07-21 NOTE — Progress Notes (Signed)
Called to room to assist during endoscopic procedure.  Patient ID and intended procedure confirmed with present staff. Received instructions for my participation in the procedure from the performing physician.  

## 2021-07-21 NOTE — Patient Instructions (Addendum)
I did not see any signs of polyp remaining but took biopsies.  I will let you know results and plans as before.  Good to see that you are on blood pressure medicine.  I sent an Rx for the hemorrhoid cream. If yours continue to bother you I believe you should see a surgeon for help.  I appreciate the opportunity to care for you. Gatha Mayer, MD, FACG  YOU HAD AN ENDOSCOPIC PROCEDURE TODAY AT [facility]:   Refer to the procedure report that was given to you for any specific questions about what was found during the examination.  If the procedure report does not answer your questions, please call your gastroenterologist to clarify.  If you requested that your care partner not be given the details of your procedure findings, then the procedure report has been included in a sealed envelope for you to review at your convenience later.  YOU SHOULD EXPECT: Some feelings of bloating in the abdomen. Passage of more gas than usual.  Walking can help get rid of the air that was put into your GI tract during the procedure and reduce the bloating. If you had a lower endoscopy (such as a colonoscopy or flexible sigmoidoscopy) you may notice spotting of blood in your stool or on the toilet paper. If you underwent a bowel prep for your procedure, you may not have a normal bowel movement for a few days.  Please Note:  You might notice some irritation and congestion in your nose or some drainage.  This is from the oxygen used during your procedure.  There is no need for concern and it should clear up in a day or so.  SYMPTOMS TO REPORT IMMEDIATELY:  Following lower endoscopy (colonoscopy or flexible sigmoidoscopy):  Excessive amounts of blood in the stool  Significant tenderness or worsening of abdominal pains  Swelling of the abdomen that is new, acute  Fever of 100F or higher   For urgent or emergent issues, a gastroenterologist can be reached at any hour by calling [phone]  [phone]. Do not use MyChart messaging for urgent concerns.    DIET:  We do recommend a small meal at first, but then you may proceed to your regular diet.  Drink plenty of fluids but you should avoid alcoholic beverages for 24 hours.  ACTIVITY:  You should plan to take it easy for the rest of today and you should NOT DRIVE or use heavy machinery until tomorrow (because of the sedation medicines used during the test).    FOLLOW UP: Our staff will call the number listed on your records 48-72 hours following your procedure to check on you and address any questions or concerns that you may have regarding the information given to you following your procedure. If we do not reach you, we will leave a message.  We will attempt to reach you two times.  During this call, we will ask if you have developed any symptoms of COVID 19. If you develop any symptoms (ie: fever, flu-like symptoms, shortness of breath, cough etc.) before then, please call [phone].  If you test positive for Covid 19 in the 2 weeks post procedure, please call and report this information to [place].    If any biopsies were taken you will be contacted by phone or by letter within the next 1-3 weeks.  Please call us at [phone] if you have not heard about the biopsies in 3 weeks.    SIGNATURES/CONFIDENTIALITY: You and/or  your care partner have signed paperwork which will be entered into your electronic medical record.  These signatures attest to the fact that that the information above on your After Visit Summary has been reviewed and is understood.  Full responsibility of the confidentiality of this discharge information lies with you and/or your care-partner.

## 2021-07-21 NOTE — Op Note (Signed)
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 The patient's tolerance of                            previous anesthesia was also reviewed. The risks                            and benefits of the procedure and the sedation                            options and risks were discussed with the patient.                            All questions were answered, and informed consent                            was obtained. Prior Anticoagulants: The patient has                            taken no previous anticoagulant or antiplatelet                            agents. ASA Grade Assessment: II - A patient with                            mild systemic disease. After reviewing the risks                            and benefits, the patient was deemed in                            satisfactory condition to undergo the procedure.  After obtaining informed consent, the scope was                            passed under direct vision. The 0441 PCF-H190TL                            Slim SB Colonoscope was introduced through the anus                            and advanced to the the descending colon. The                            flexible sigmoidoscopy was accomplished without                             difficulty. The patient tolerated the procedure                            well. The quality of the bowel preparation was                            excellent. Scope In: Scope Out: Findings:                 Hemorrhoids were found on perianal exam.                           A post polypectomy scar was found in the sigmoid                            colon. There was no evidence of the previous polyp.                            This was biopsied with a cold forceps for                            histology. Verification of patient identification                            for the specimen was done. Estimated blood loss was                            minimal.                           A tattoo was seen in the sigmoid colon.                           Multiple small and large-mouthed diverticula were                            found in the sigmoid colon. There was narrowing of                            the colon  in association with the diverticular                            opening.                           External and internal hemorrhoids were found.                           The exam was otherwise without abnormality. Complications:            No immediate complications. Estimated Blood Loss:     Estimated blood loss was minimal. Impression:               - Hemorrhoids found on perianal exam.                           - Post-polypectomy scar in the sigmoid colon.                            Biopsied.                           - A tattoo was seen in the sigmoid colon.                           - Severe diverticulosis in the sigmoid colon. There                            was narrowing of the colon in association with the                            diverticular opening.                           - External and internal hemorrhoids.                           - The examination was otherwise normal. Recommendation:           - Patient has a contact number available for                             emergencies. The signs and symptoms of potential                            delayed complications were discussed with the                            patient. Return to normal activities tomorrow.                            Written discharge instructions were provided to the                            patient.                           -  Await pathology results.                           - I sent Rx for hydrocortisone hemorrhoid cream                           If further treatment needed i think should see a                            surgeon as large external component would not be                            amenable to banding Gatha Mayer, MD 07/21/2021 8:23:35 AM This report has been signed electronically.

## 2021-07-21 NOTE — Progress Notes (Signed)
Report given to PACU, vss 

## 2021-07-25 ENCOUNTER — Telehealth: Payer: Self-pay | Admitting: *Deleted

## 2021-07-25 ENCOUNTER — Telehealth: Payer: Self-pay

## 2021-07-25 NOTE — Telephone Encounter (Signed)
  Follow up Call-  Call back number 07/21/2021 03/10/2021  Post procedure Call Back phone  # [phone]  Permission to leave phone message Yes Yes  Some recent data might be hidden     Patient questions:  Do you have a fever, pain , or abdominal swelling? No. Pain Score  0 *  Have you tolerated food without any problems? Yes.    Have you been able to return to your normal activities? Yes.    Do you have any questions about your discharge instructions: Diet   No. Medications  No. Follow up visit  No.  Do you have questions or concerns about your Care? No.  Actions: * If pain score is 4 or above: No action needed, pain <4. Have you developed a fever since your procedure? no  2.   Have you had an respiratory symptoms (SOB or cough) since your procedure? no  3.   Have you tested positive for COVID 19 since your procedure no  4.   Have you had any family members/close contacts diagnosed with the COVID 19 since your procedure?  no   If yes to any of these questions please route to Joylene John, RN and Joella Prince, RN

## 2021-07-25 NOTE — Telephone Encounter (Signed)
  Follow up Call-  Call back number 07/21/2021 03/10/2021  Post procedure Call Back phone  # [phone]  Permission to leave phone message Yes Yes  Some recent data might be hidden     Patient questions:  Do you have a fever, pain , or abdominal swelling? No. Pain Score  0 *  Have you tolerated food without any problems? Yes.    Have you been able to return to your normal activities? Yes.    Do you have any questions about your discharge instructions: Diet   No. Medications  No. Follow up visit  No.  Do you have questions or concerns about your Care? No.  Actions: * If pain score is 4 or above: No action needed, pain <4.  Have you developed a fever since your procedure? no  2.   Have you had an respiratory symptoms (SOB or cough) since your procedure? no  3.   Have you tested positive for COVID 19 since your procedure no  4.   Have you had any family members/close contacts diagnosed with the COVID 19 since your procedure?  no   If yes to any of these questions please route to Joylene John, RN and Joella Prince, RN

## 2021-08-07 ENCOUNTER — Encounter: Payer: Self-pay | Admitting: Internal Medicine

## 2022-09-18 DIAGNOSIS — Z1231 Encounter for screening mammogram for malignant neoplasm of breast: Secondary | ICD-10-CM | POA: Diagnosis not present

## 2022-09-18 DIAGNOSIS — R6882 Decreased libido: Secondary | ICD-10-CM | POA: Diagnosis not present

## 2022-09-18 DIAGNOSIS — Z01419 Encounter for gynecological examination (general) (routine) without abnormal findings: Secondary | ICD-10-CM | POA: Diagnosis not present

## 2022-09-18 DIAGNOSIS — Z6836 Body mass index (BMI) 36.0-36.9, adult: Secondary | ICD-10-CM | POA: Diagnosis not present

## 2022-10-19 DIAGNOSIS — I10 Essential (primary) hypertension: Secondary | ICD-10-CM | POA: Diagnosis not present

## 2022-10-19 DIAGNOSIS — E785 Hyperlipidemia, unspecified: Secondary | ICD-10-CM | POA: Diagnosis not present

## 2022-10-19 DIAGNOSIS — R030 Elevated blood-pressure reading, without diagnosis of hypertension: Secondary | ICD-10-CM | POA: Diagnosis not present

## 2022-12-10 ENCOUNTER — Ambulatory Visit
Admission: EM | Admit: 2022-12-10 | Discharge: 2022-12-10 | Disposition: A | Discharge status: Home / Self Care | Payer: BC Managed Care – PPO | Attending: Nurse Practitioner | Admitting: Nurse Practitioner

## 2022-12-10 DIAGNOSIS — Z1152 Encounter for screening for COVID-19: Secondary | ICD-10-CM | POA: Insufficient documentation

## 2022-12-10 DIAGNOSIS — J069 Acute upper respiratory infection, unspecified: Secondary | ICD-10-CM

## 2022-12-10 DIAGNOSIS — R6889 Other general symptoms and signs: Secondary | ICD-10-CM | POA: Insufficient documentation

## 2022-12-10 DIAGNOSIS — R059 Cough, unspecified: Secondary | ICD-10-CM | POA: Insufficient documentation

## 2022-12-10 DIAGNOSIS — J101 Influenza due to other identified influenza virus with other respiratory manifestations: Secondary | ICD-10-CM | POA: Insufficient documentation

## 2022-12-10 DIAGNOSIS — Z79899 Other long term (current) drug therapy: Secondary | ICD-10-CM | POA: Diagnosis not present

## 2022-12-10 DIAGNOSIS — R112 Nausea with vomiting, unspecified: Secondary | ICD-10-CM | POA: Diagnosis not present

## 2022-12-10 LAB — RESP PANEL BY RT-PCR (FLU A&B, COVID) ARPGX2
Influenza A by PCR: POSITIVE — AB
Influenza B by PCR: NEGATIVE
SARS Coronavirus 2 by RT PCR: NEGATIVE

## 2022-12-10 MED ORDER — ONDANSETRON 4 MG PO TBDP
4.0000 mg | ORAL_TABLET | Freq: Once | ORAL | Status: AC
  Administered 2022-12-10: 4 mg via ORAL

## 2022-12-10 MED ORDER — BENZONATATE 100 MG PO CAPS: 100.0000 mg | ORAL_CAPSULE | Freq: Three times a day (TID) | ORAL | 0 refills | Status: AC | PRN

## 2022-12-10 MED ORDER — ONDANSETRON 4 MG PO TBDP: 4.0000 mg | ORAL_TABLET | Freq: Three times a day (TID) | ORAL | 0 refills | Status: AC | PRN

## 2022-12-10 MED ORDER — IBUPROFEN 400 MG PO TABS
400.0000 mg | ORAL_TABLET | Freq: Once | ORAL | Status: AC
  Administered 2022-12-10: 400 mg via ORAL

## 2022-12-10 MED ORDER — OSELTAMIVIR PHOSPHATE 75 MG PO CAPS: 75.0000 mg | ORAL_CAPSULE | Freq: Two times a day (BID) | ORAL | 0 refills | Status: AC

## 2022-12-10 MED ORDER — PROMETHAZINE-DM 6.25-15 MG/5ML PO SYRP: 5.0000 mL | ORAL_SOLUTION | Freq: Every evening | ORAL | 0 refills | Status: AC | PRN

## 2022-12-10 NOTE — Discharge Instructions (Addendum)
You have a viral upper respiratory infection.  Symptoms should improve over the next week to 10 days. We have tested you today for COVID-19 and influenza.  You will see the results in Mychart and we will call you with positive results.    Please stay home and isolate until you are aware of the results.  I am highly suspicious that you have influenza.  Please start on Tamiflu to treat this.  You can use Zofran every 8 hours as needed for nausea/vomiting.  We gave you a dose of Zofran at 6:15 PM.  Do not take this with the cough syrup.  You can take Tylenol 500 to 1000 mg every 6 hours as needed for fever/pain.  Your last dose of this was at 11:30 AM.  You can take ibuprofen 400 to 800 mg every 8 hours as needed for fever/pain.  Last dose was at 6:15 PM.  Some things that can make you feel better are: - Increased rest - Increasing fluid with water/sugar free electrolytes - Acetaminophen and ibuprofen as needed for fever/pain - Salt water gargling, chloraseptic spray and throat lozenges for sore throat - OTC guaifenesin (Mucinex) 600 mg twice daily for congestion - Saline sinus flushes or a neti pot - Humidifying the air -Tessalon Perles during the day as needed for dry cough and cough syrup at nighttime as needed for dry cough

## 2022-12-10 NOTE — ED Triage Notes (Signed)
Pt loss of appetite, nauseas vomiting, achy, coughing, dry heaves, fatigue, nasal congestion, body aches that started last night. Mom and coworkers have the flu and she has made contact. Took tylenol and sudafed but no relief.

## 2022-12-10 NOTE — ED Provider Notes (Signed)
[facility]    CSN: [id number] Arrival date & time: 12/10/22  1627      History   Chief Complaint Chief Complaint  Patient presents with   Cough   Fatigue   Generalized Body Aches    HPI Jennifer Landry is a 56 y.o. female.   Patient presents today for 1 day of cough that is a little bit productive, fever, body aches, chills, nasal congestion and runny nose, scratchy throat, headache, nausea and vomiting from coughing, decreased appetite, and fatigue.  She denies shortness of breath or chest pain, ear pain, abdominal pain, diarrhea, loss of taste or smell.  Reports she has been drinking plenty of fluids today, however has not wanted to eat anything.  Reports she has been around coworkers who have tested positive for the flu.  She has taken Tylenol and Sudafed for symptoms without much benefit.    Past Medical History:  Diagnosis Date   Anemia    COVID-19 11/2020   GERD (gastroesophageal reflux disease)    Headache    Hx of adenomatous polyp of colon 03/24/2021   Hypertension     Patient Active Problem List   Diagnosis Date Noted   Hx of adenomatous polyp of colon 03/24/2021   Anemia 02/21/2017   Uterine fibroid 02/21/2017   Uterine leiomyoma 01/18/2017    Past Surgical History:  Procedure Laterality Date   BREAST LUMPECTOMY Right 1989, 2008   COLONOSCOPY  03/10/2021   Gessner   COLPOSCOPY  12/2011   HYSTERECTOMY ABDOMINAL WITH SALPINGECTOMY Bilateral 02/21/2017   Procedure: ABDOMINAL HYSTERECTOMY FOR UTERUS GREATER THAN 250GMS WITH RIGHT SALPINGECTOMY,   LEFT SALPINGOOPHERECTOMY;  Surgeon: Everitt Amber, MD;  Location: [facility];  Service: Gynecology;  Laterality: Bilateral;   POLYPECTOMY      OB History   No obstetric history on file.      Home Medications    Prior to Admission medications   Medication Sig Start Date End Date Taking? Authorizing Provider  benzonatate (TESSALON) 100 MG capsule Take 1 capsule (100 mg total) by mouth 3 (three)  times daily as needed for cough. Do not take with alcohol or while driving or operating heavy machinery.  May cause drowsiness. 12/10/22  Yes Eulogio Bear, NP  ondansetron (ZOFRAN-ODT) 4 MG disintegrating tablet Take 1 tablet (4 mg total) by mouth every 8 (eight) hours as needed for nausea or vomiting. 12/10/22  Yes Eulogio Bear, NP  oseltamivir (TAMIFLU) 75 MG capsule Take 1 capsule (75 mg total) by mouth every 12 (twelve) hours for 5 days. 12/10/22 12/15/22 Yes Eulogio Bear, NP  promethazine-dextromethorphan (PROMETHAZINE-DM) 6.25-15 MG/5ML syrup Take 5 mLs by mouth at bedtime as needed for cough. Do not take with alcohol or while driving or operating heavy machinery.  May cause drowsiness 12/10/22  Yes Noemi Chapel A, NP  acetaminophen (TYLENOL) 500 MG tablet Take 1,000 mg by mouth every 6 (six) hours as needed (For pain.).    [provider]  AMBULATORY NON FORMULARY MEDICATION Medication Name: collagen peptides daily    [provider]  Cholecalciferol (D3-1000 PO) Take 1 tablet by mouth daily.    [provider]  Cyanocobalamin (VITAMIN B 12 PO) Take 1 tablet by mouth daily.    [provider]  hydrocortisone (ANUSOL-HC) 2.5 % rectal cream Place 1 application rectally 2 (two) times daily. 07/21/21   Gatha Mayer, MD  ibuprofen (ADVIL,MOTRIN) 200 MG tablet Take 400 mg by mouth every 6 (six) hours as  needed (for pain.). Patient not taking: No sig reported    [provider]  losartan (COZAAR) 100 MG tablet Take 100 mg by mouth daily.    [provider]  Multiple Vitamin (MULTIVITAMIN WITH MINERALS) TABS tablet Take 1 tablet by mouth at bedtime. Patient not taking: No sig reported    [provider]  Peppermint Oil (IBGARD PO) Take 2 tablets by mouth daily.    [provider]  Zinc Sulfate (ZINC 15 PO) Take 1 tablet by mouth daily.    [provider]    Family History Family History   Problem Relation Age of Onset   Colon polyps Mother    Heart disease Mother    Colon polyps Father    Diabetes Father    Heart disease Father    Parkinson's disease Father    Heart disease Brother    Heart disease Brother    Colon cancer Maternal Grandmother    Other Maternal Grandfather        unknown   Heart disease Paternal Grandmother    Diabetes Paternal Grandmother    Heart disease Paternal Grandfather    Diabetes Paternal Grandfather    Esophageal cancer Neg Hx    Stomach cancer Neg Hx    Rectal cancer Neg Hx     Social History Social History   Tobacco Use   Smoking status: Never   Smokeless tobacco: Never  Vaping Use   Vaping Use: Never used  Substance Use Topics   Alcohol use: No   Drug use: No     Allergies   Patient has no known allergies.   Review of Systems Review of Systems Per HPI  Physical Exam Triage Vital Signs ED Triage Vitals  Enc Vitals Group     BP 12/10/22 1756 135/89     Pulse Rate 12/10/22 1756 (!) 114     Resp 12/10/22 1756 18     Temp 12/10/22 1756 (!) 101.6 F (38.7 C)     Temp Source 12/10/22 1756 Oral     SpO2 12/10/22 1756 96 %     Weight --      Height --      Head Circumference --      Peak Flow --      Pain Score 12/10/22 1805 0     Pain Loc --      Pain Edu? --      Excl. in [place]? --    No data found.  Updated Vital Signs BP 135/89 (BP Location: Right Arm)   Pulse (!) 114   Temp (!) 101.6 F (38.7 C) (Oral)   Resp 18   LMP 01/30/2017 (Exact Date)   SpO2 96%   Visual Acuity Right Eye Distance:   Left Eye Distance:   Bilateral Distance:    Right Eye Near:   Left Eye Near:    Bilateral Near:     Physical Exam Vitals and nursing note reviewed.  Constitutional:      General: She is not in acute distress.    Appearance: Normal appearance. She is ill-appearing. She is not toxic-appearing.  HENT:     Head: Normocephalic and atraumatic.     Right Ear: Tympanic membrane, ear canal and external ear  normal.     Left Ear: Tympanic membrane, ear canal and external ear normal.     Nose: Congestion present. No rhinorrhea.     Mouth/Throat:     Mouth: Mucous membranes are moist.  Pharynx: Oropharynx is clear. No oropharyngeal exudate or posterior oropharyngeal erythema.  Eyes:     General: No scleral icterus.    Extraocular Movements: Extraocular movements intact.  Cardiovascular:     Rate and Rhythm: Normal rate and regular rhythm.     Heart sounds: No murmur heard. Pulmonary:     Effort: Pulmonary effort is normal. No respiratory distress.     Breath sounds: Normal breath sounds. No wheezing, rhonchi or rales.  Abdominal:     General: Abdomen is flat. Bowel sounds are normal. There is no distension.     Palpations: Abdomen is soft.     Tenderness: There is no abdominal tenderness.  Musculoskeletal:     Cervical back: Normal range of motion and neck supple.  Lymphadenopathy:     Cervical: No cervical adenopathy.  Skin:    General: Skin is warm and dry.     Coloration: Skin is not jaundiced or pale.     Findings: No erythema or rash.  Neurological:     Mental Status: She is alert and oriented to person, place, and time.     Motor: No weakness.  Psychiatric:        Behavior: Behavior is cooperative.      UC Treatments / Results  Labs (all labs ordered are listed, but only abnormal results are displayed) Labs Reviewed  RESP PANEL BY RT-PCR (FLU A&B, COVID) ARPGX2    EKG   Radiology No results found.  Procedures Procedures (including critical care time)  Medications Ordered in UC Medications  ibuprofen (ADVIL) tablet 400 mg (400 mg Oral Given 12/10/22 1813)  ondansetron (ZOFRAN-ODT) disintegrating tablet 4 mg (4 mg Oral Given 12/10/22 1813)    Initial Impression / Assessment and Plan / UC Course  I have reviewed the triage vital signs and the nursing notes.  Pertinent labs & imaging results that were available during my care of the patient were reviewed by  me and considered in my medical decision making (see chart for details).   Patient is ill-appearing, normotensive, not tachypneic, and oxygenating well on room air.  She has a fever and is slightly tachycardic today, the 2 of which are likely related to the acute illness today.    Flu-like symptoms Nausea and vomiting, unspecified vomiting type Viral URI with cough Suspect viral etiology COVID-19, influenza testing obtained I am suspicious for influenza, will treat empirically with Tamiflu twice daily for 5 days Supportive care discussed with patient Start cough suppressants Zofran ODT given in urgent care today which helped with nausea Push hydration Strict ER precautions discussed Note given for work  The patient was given the opportunity to ask questions.  All questions answered to their satisfaction.  The patient is in agreement to this plan.    Final Clinical Impressions(s) / UC Diagnoses   Final diagnoses:  Flu-like symptoms  Nausea and vomiting, unspecified vomiting type  Viral URI with cough     Discharge Instructions      You have a viral upper respiratory infection.  Symptoms should improve over the next week to 10 days. We have tested you today for COVID-19 and influenza.  You will see the results in Mychart and we will call you with positive results.    Please stay home and isolate until you are aware of the results.  I am highly suspicious that you have influenza.  Please start on Tamiflu to treat this.  You can use Zofran every 8 hours as needed for nausea/vomiting.  We gave you a dose of Zofran at 6:15 PM.  Do not take this with the cough syrup.  You can take Tylenol 500 to 1000 mg every 6 hours as needed for fever/pain.  Your last dose of this was at 11:30 AM.  You can take ibuprofen 400 to 800 mg every 8 hours as needed for fever/pain.  Last dose was at 6:15 PM.  Some things that can make you feel better are: - Increased rest - Increasing fluid with  water/sugar free electrolytes - Acetaminophen and ibuprofen as needed for fever/pain - Salt water gargling, chloraseptic spray and throat lozenges for sore throat - OTC guaifenesin (Mucinex) 600 mg twice daily for congestion - Saline sinus flushes or a neti pot - Humidifying the air -Tessalon Perles during the day as needed for dry cough and cough syrup at nighttime as needed for dry cough     ED Prescriptions     Medication Sig Dispense Auth. Provider   benzonatate (TESSALON) 100 MG capsule Take 1 capsule (100 mg total) by mouth 3 (three) times daily as needed for cough. Do not take with alcohol or while driving or operating heavy machinery.  May cause drowsiness. 21 capsule Noemi Chapel A, NP   promethazine-dextromethorphan (PROMETHAZINE-DM) 6.25-15 MG/5ML syrup Take 5 mLs by mouth at bedtime as needed for cough. Do not take with alcohol or while driving or operating heavy machinery.  May cause drowsiness 118 mL Noemi Chapel A, NP   oseltamivir (TAMIFLU) 75 MG capsule Take 1 capsule (75 mg total) by mouth every 12 (twelve) hours for 5 days. 10 capsule Noemi Chapel A, NP   ondansetron (ZOFRAN-ODT) 4 MG disintegrating tablet Take 1 tablet (4 mg total) by mouth every 8 (eight) hours as needed for nausea or vomiting. 20 tablet Eulogio Bear, NP      PDMP not reviewed this encounter.   Eulogio Bear, NP 12/10/22 [phone]

## 2023-06-26 DIAGNOSIS — E785 Hyperlipidemia, unspecified: Secondary | ICD-10-CM | POA: Diagnosis not present

## 2023-06-26 DIAGNOSIS — I10 Essential (primary) hypertension: Secondary | ICD-10-CM | POA: Diagnosis not present

## 2023-06-26 DIAGNOSIS — R7303 Prediabetes: Secondary | ICD-10-CM | POA: Diagnosis not present

## 2023-06-28 DIAGNOSIS — R7303 Prediabetes: Secondary | ICD-10-CM | POA: Diagnosis not present

## 2023-06-28 DIAGNOSIS — I10 Essential (primary) hypertension: Secondary | ICD-10-CM | POA: Diagnosis not present

## 2023-06-28 DIAGNOSIS — E782 Mixed hyperlipidemia: Secondary | ICD-10-CM | POA: Diagnosis not present

## 2023-09-27 DIAGNOSIS — I10 Essential (primary) hypertension: Secondary | ICD-10-CM | POA: Diagnosis not present

## 2023-09-27 DIAGNOSIS — R7303 Prediabetes: Secondary | ICD-10-CM | POA: Diagnosis not present

## 2023-09-27 DIAGNOSIS — E782 Mixed hyperlipidemia: Secondary | ICD-10-CM | POA: Diagnosis not present

## 2023-10-02 DIAGNOSIS — Z01419 Encounter for gynecological examination (general) (routine) without abnormal findings: Secondary | ICD-10-CM | POA: Diagnosis not present

## 2023-10-02 DIAGNOSIS — Z1231 Encounter for screening mammogram for malignant neoplasm of breast: Secondary | ICD-10-CM | POA: Diagnosis not present

## 2023-10-04 ENCOUNTER — Other Ambulatory Visit: Payer: Self-pay | Admitting: Obstetrics & Gynecology

## 2023-10-04 DIAGNOSIS — R928 Other abnormal and inconclusive findings on diagnostic imaging of breast: Secondary | ICD-10-CM

## 2023-10-07 ENCOUNTER — Ambulatory Visit: Payer: BC Managed Care – PPO

## 2023-10-07 ENCOUNTER — Ambulatory Visit
Admission: RE | Admit: 2023-10-07 | Discharge: 2023-10-07 | Disposition: A | Discharge status: Home / Self Care | Payer: BC Managed Care – PPO | Source: Ambulatory Visit | Attending: Obstetrics & Gynecology | Admitting: Obstetrics & Gynecology

## 2023-10-07 DIAGNOSIS — R928 Other abnormal and inconclusive findings on diagnostic imaging of breast: Secondary | ICD-10-CM

## 2024-01-10 DIAGNOSIS — E782 Mixed hyperlipidemia: Secondary | ICD-10-CM | POA: Diagnosis not present

## 2024-01-10 DIAGNOSIS — I10 Essential (primary) hypertension: Secondary | ICD-10-CM | POA: Diagnosis not present

## 2024-01-10 DIAGNOSIS — R7303 Prediabetes: Secondary | ICD-10-CM | POA: Diagnosis not present

## 2024-04-08 DIAGNOSIS — E785 Hyperlipidemia, unspecified: Secondary | ICD-10-CM | POA: Diagnosis not present

## 2024-04-08 DIAGNOSIS — I10 Essential (primary) hypertension: Secondary | ICD-10-CM | POA: Diagnosis not present

## 2024-04-10 DIAGNOSIS — E782 Mixed hyperlipidemia: Secondary | ICD-10-CM | POA: Diagnosis not present

## 2024-04-10 DIAGNOSIS — I10 Essential (primary) hypertension: Secondary | ICD-10-CM | POA: Diagnosis not present

## 2024-04-10 DIAGNOSIS — E669 Obesity, unspecified: Secondary | ICD-10-CM | POA: Diagnosis not present

## 2024-04-10 DIAGNOSIS — R7303 Prediabetes: Secondary | ICD-10-CM | POA: Diagnosis not present

## 2024-05-28 ENCOUNTER — Ambulatory Visit
Admission: EM | Admit: 2024-05-28 | Discharge: 2024-05-28 | Disposition: A | Discharge status: Home / Self Care | Attending: Nurse Practitioner | Admitting: Nurse Practitioner

## 2024-05-28 DIAGNOSIS — J069 Acute upper respiratory infection, unspecified: Secondary | ICD-10-CM | POA: Diagnosis not present

## 2024-05-28 LAB — POC SARS CORONAVIRUS 2 AG -  ED: SARS Coronavirus 2 Ag: NEGATIVE

## 2024-05-28 MED ORDER — PSEUDOEPH-BROMPHEN-DM 30-2-10 MG/5ML PO SYRP: 5.0000 mL | ORAL_SOLUTION | Freq: Four times a day (QID) | ORAL | 0 refills | Status: AC | PRN

## 2024-05-28 MED ORDER — CETIRIZINE HCL 10 MG PO TABS: 10.0000 mg | ORAL_TABLET | Freq: Every day | ORAL | 0 refills | Status: AC

## 2024-05-28 NOTE — Discharge Instructions (Signed)
 The COVID test was negative. Take medication as prescribed.  Please monitor your blood pressure while you are taking the cough medication as it has a risk of impacting your blood pressure. You may take over-the-counter Tylenol  or ibuprofen  as needed for pain, fever, or general discomfort. Recommend normal saline nasal spray throughout the day for nasal congestion and runny nose. For your cough, recommend use of a humidifier in your bedroom at nighttime during sleep and sleeping elevated on pillows while symptoms persist. As discussed, symptoms should improve over the next 5 to 7 days.  If symptoms fail to improve, or appear to be worsening, you may follow-up in this clinic or with your primary care physician for further evaluation. Follow-up as needed.

## 2024-05-28 NOTE — ED Provider Notes (Signed)
 [facility]    CSN: [id number] Arrival date & time: 05/28/24  0844      History   Chief Complaint No chief complaint on file.   HPI Jennifer Landry is a 58 y.o. female.   The history is provided by the patient.   Patient presents with a 2-day history of nasal congestion, runny nose, and cough.  Patient denies fever, chills, headache, ear pain, wheezing, difficulty breathing, chest pain, abdominal pain, nausea, vomiting, diarrhea, or rash.  Patient reports she has been taking several over-the-counter cough and cold medications for her symptoms.  She denies any obvious known sick contacts.  Past Medical History:  Diagnosis Date   Anemia    COVID-19 11/2020   GERD (gastroesophageal reflux disease)    Headache    Hx of adenomatous polyp of colon 03/24/2021   Hypertension     Patient Active Problem List   Diagnosis Date Noted   Hx of adenomatous polyp of colon 03/24/2021   Anemia 02/21/2017   Uterine fibroid 02/21/2017   Uterine leiomyoma 01/18/2017    Past Surgical History:  Procedure Laterality Date   BREAST LUMPECTOMY Right 1989, 2008   COLONOSCOPY  03/10/2021   [facility]   COLPOSCOPY  12/2011   HYSTERECTOMY ABDOMINAL WITH SALPINGECTOMY Bilateral 02/21/2017   Procedure: ABDOMINAL HYSTERECTOMY FOR UTERUS GREATER THAN 250GMS WITH RIGHT SALPINGECTOMY,   LEFT SALPINGOOPHERECTOMY;  Surgeon: Alphonso Aschoff, MD;  Location: [facility];  Service: Gynecology;  Laterality: Bilateral;   POLYPECTOMY      OB History   No obstetric history on file.      Home Medications    Prior to Admission medications   Medication Sig Start Date End Date Taking? Authorizing Provider  acetaminophen  (TYLENOL ) 500 MG tablet Take 1,000 mg by mouth every 6 (six) hours as needed (For pain.).    [provider]  AMBULATORY NON FORMULARY MEDICATION Medication Name: collagen peptides daily    [provider]  benzonatate  (TESSALON ) 100 MG capsule Take 1 capsule (100 mg  total) by mouth 3 (three) times daily as needed for cough. Do not take with alcohol or while driving or operating heavy machinery.  May cause drowsiness. 12/10/22   Wilhemena Harbour, NP  Cholecalciferol (D3-1000 PO) Take 1 tablet by mouth daily.    [provider]  Cyanocobalamin (VITAMIN B 12 PO) Take 1 tablet by mouth daily.    [provider]  hydrocortisone  (ANUSOL -HC) 2.5 % rectal cream Place 1 application rectally 2 (two) times daily. 07/21/21   Kenney Peacemaker, MD  ibuprofen  (ADVIL ,MOTRIN ) 200 MG tablet Take 400 mg by mouth every 6 (six) hours as needed (for pain.). Patient not taking: No sig reported    [provider]  losartan (COZAAR) 100 MG tablet Take 100 mg by mouth daily.    [provider]  Multiple Vitamin (MULTIVITAMIN WITH MINERALS) TABS tablet Take 1 tablet by mouth at bedtime. Patient not taking: No sig reported    [provider]  ondansetron  (ZOFRAN -ODT) 4 MG disintegrating tablet Take 1 tablet (4 mg total) by mouth every 8 (eight) hours as needed for nausea or vomiting. 12/10/22   Wilhemena Harbour, NP  Peppermint Oil (IBGARD PO) Take 2 tablets by mouth daily.    [provider]  promethazine -dextromethorphan (PROMETHAZINE -DM) 6.25-15 MG/5ML syrup Take 5 mLs by mouth at bedtime as needed for cough. Do not take with alcohol or while driving or operating heavy machinery.  May cause drowsiness 12/10/22   Sterling Eisenmenger,  Jessica A, NP  Zinc Sulfate (ZINC 15 PO) Take 1 tablet by mouth daily.    [provider]    Family History Family History  Problem Relation Age of Onset   Colon polyps Mother    Heart disease Mother    Colon polyps Father    Diabetes Father    Heart disease Father    Parkinson's disease Father    Heart disease Brother    Heart disease Brother    Colon cancer Maternal Grandmother    Other Maternal Grandfather        unknown   Heart disease Paternal Grandmother    Diabetes Paternal  Grandmother    Heart disease Paternal Grandfather    Diabetes Paternal Grandfather    Esophageal cancer Neg Hx    Stomach cancer Neg Hx    Rectal cancer Neg Hx     Social History Social History   Tobacco Use   Smoking status: Never   Smokeless tobacco: Never  Vaping Use   Vaping status: Never Used  Substance Use Topics   Alcohol use: No   Drug use: No     Allergies   Patient has no known allergies.   Review of Systems Review of Systems Per HPI  Physical Exam Triage Vital Signs ED Triage Vitals  Encounter Vitals Group     BP 05/28/24 0918 (!) 146/82     Systolic BP Percentile --      Diastolic BP Percentile --      Pulse Rate 05/28/24 0918 66     Resp 05/28/24 0918 18     Temp 05/28/24 0918 98 F (36.7 C)     Temp Source 05/28/24 0918 Oral     SpO2 05/28/24 0918 96 %     Weight --      Height --      Head Circumference --      Peak Flow --      Pain Score 05/28/24 0921 0     Pain Loc --      Pain Education --      Exclude from Growth Chart --    No data found.  Updated Vital Signs BP (!) 146/82 (BP Location: Right Arm)   Pulse 66   Temp 98 F (36.7 C) (Oral)   Resp 18   LMP 01/30/2017 (Exact Date)   SpO2 96%   Visual Acuity Right Eye Distance:   Left Eye Distance:   Bilateral Distance:    Right Eye Near:   Left Eye Near:    Bilateral Near:     Physical Exam Vitals and nursing note reviewed.  Constitutional:      General: She is not in acute distress.    Appearance: Normal appearance.  HENT:     Head: Normocephalic.     Right Ear: Tympanic membrane, ear canal and external ear normal.     Left Ear: Tympanic membrane, ear canal and external ear normal.     Nose: Congestion present.     Right Turbinates: Enlarged and swollen.     Left Turbinates: Enlarged and swollen.     Right Sinus: No maxillary sinus tenderness or frontal sinus tenderness.     Left Sinus: No maxillary sinus tenderness or frontal sinus tenderness.     Mouth/Throat:      Lips: Pink.     Mouth: Mucous membranes are moist.     Pharynx: Uvula midline. Postnasal drip present. No pharyngeal swelling, oropharyngeal exudate, posterior oropharyngeal erythema or  uvula swelling.     Comments: Cobblestoning present to posterior oropharynx  Eyes:     Extraocular Movements: Extraocular movements intact.     Conjunctiva/sclera: Conjunctivae normal.     Pupils: Pupils are equal, round, and reactive to light.  Cardiovascular:     Rate and Rhythm: Normal rate and regular rhythm.     Pulses: Normal pulses.     Heart sounds: Normal heart sounds.  Pulmonary:     Effort: Pulmonary effort is normal. No respiratory distress.     Breath sounds: Normal breath sounds. No stridor. No wheezing, rhonchi or rales.  Abdominal:     General: Bowel sounds are normal.     Palpations: Abdomen is soft.     Tenderness: There is no abdominal tenderness.  Musculoskeletal:     Cervical back: Normal range of motion.  Skin:    General: Skin is warm and dry.  Neurological:     General: No focal deficit present.     Mental Status: She is alert and oriented to person, place, and time.  Psychiatric:        Mood and Affect: Mood normal.        Behavior: Behavior normal.      UC Treatments / Results  Labs (all labs ordered are listed, but only abnormal results are displayed) Labs Reviewed  POC SARS CORONAVIRUS 2 AG -  ED    EKG   Radiology No results found.  Procedures Procedures (including critical care time)  Medications Ordered in UC Medications - No data to display  Initial Impression / Assessment and Plan / UC Course  I have reviewed the triage vital signs and the nursing notes.  Pertinent labs & imaging results that were available during my care of the patient were reviewed by me and considered in my medical decision making (see chart for details).  COVID test was negative.  The patient is well-appearing, she is in no acute distress, lung sounds are clear throughout,  room air sats at 96%.  Symptoms are consistent with a viral URI with cough.  Will provide symptomatic treatment with Bromfed-DM and cetirizine 10 mg.  Supportive care recommendations were provided and discussed with the patient to include fluids, rest, over-the-counter analgesics, normal saline nasal spray, use of a humidifier.  Discussed indications with patient regarding follow-up.  Patient was in agreement with this plan of care and verbalizes understanding.  All questions were answered.  Patient stable for discharge.  Work note was provided.  Final Clinical Impressions(s) / UC Diagnoses   Final diagnoses:  None   Discharge Instructions   None    ED Prescriptions   None    PDMP not reviewed this encounter.   Hardy Lia, NP 05/28/24 [phone]

## 2024-05-28 NOTE — ED Triage Notes (Signed)
 Pt reports congestion and cough x 2 days

## 2024-06-17 ENCOUNTER — Encounter: Payer: Self-pay | Admitting: Internal Medicine

## 2024-06-17 ENCOUNTER — Encounter: Payer: Self-pay | Admitting: Gastroenterology

## 2024-07-17 DIAGNOSIS — I10 Essential (primary) hypertension: Secondary | ICD-10-CM | POA: Diagnosis not present

## 2024-07-17 DIAGNOSIS — E782 Mixed hyperlipidemia: Secondary | ICD-10-CM | POA: Diagnosis not present

## 2024-07-17 DIAGNOSIS — R7303 Prediabetes: Secondary | ICD-10-CM | POA: Diagnosis not present

## 2024-07-31 ENCOUNTER — Encounter

## 2024-08-10 NOTE — Progress Notes (Signed)
 [name] [id number] May 21, 1966   Chief Complaint: Heartburn, trouble swallowing  Referring Provider: No ref. provider found Primary GI MD: Dr. Avram  HPI: Jennifer Landry is a 58 y.o. female with past medical history of anemia, GERD, adenomatous colon polyps, HTN, abdominal hysterectomy, right breast lumpectomy who presents today to discuss EGD and colonoscopy.    Patient last seen in office 01/09/2021 by Dr. Avram to discuss screening colonoscopy.  At that time endorsed lifelong history of mild alternating bowel habits consistent with IBS, self diagnosed.  Reported moving her bowels daily with occasional loose stools.  Overall symptoms are chronic and stable.  Endorsed occasional BRBPR with wiping only and associated with prolapse/swollen hemorrhoids, problem since childbirth.  Maternal grandmother had colon cancer.  Had a 15 to 20 mm adenomatous colon polyp removed on last colonoscopy, with follow-up sigmoidoscopy 4 months later.  Due for repeat colonoscopy.   Patient states she has had trouble swallowing certain foods over the last year.  Has problems particularly with meat and corn.  Feels like food gets stuck in her lower chest and she has to regurgitate food.  When she starts eating again food passes without a problem into her stomach.  This happens about once a week, maybe less.  She is also having heartburn about once a week, worse with certain foods like chocolate.  She is taking Tums as needed which does relieve her symptoms.  Has not been on antireflux medications before.  Heartburn is also new in the last year.  She denies any abdominal pain, nausea, vomiting.  Has some intermittent constipation which she attributes to not drinking enough water.  Reports history of IBS and notes that foods like leafy greens, broccoli, beans trigger abdominal discomfort and flatulence.  She does okay if she avoids these foods.  Denies any blood in her stool or melena.  States she  does not have a PCP but follows with cardiology for high blood pressure.  Reports having annual labs with them which have been stable.  Records unavailable to me.  She has been having problems with nasal congestion over the last 4 months or so.  She is seeing ear nose and throat next Thursday about a nasal polyp.  She also has some concerns that there may be mold in her house that she is having an allergic response to.  Has some trouble breathing through her nose during the day and worse at night.  She denies any chest pain or shortness of breath/sensation she cannot catch her breath.  Previous GI Procedures/Imaging   Flexible sigmoidoscopy 07/21/2021 - Hemorrhoids found on perianal exam.  - Post- polypectomy scar in the sigmoid colon. Biopsied.  - A tattoo was seen in the sigmoid colon.  - Severe diverticulosis in the sigmoid colon. There was narrowing of the colon in association with the diverticular opening.  - External and internal hemorrhoids.  - The examination was otherwise normal. - Recall 2025  Colonoscopy 03/10/2021 - Hemorrhoids found on perianal exam.  - One 15 to 20 mm polyp in the distal sigmoid colon, removed piecemeal using a hot snare. Resected and retrieved. Tattooed.  - Diverticulosis in the sigmoid colon.  - External and internal hemorrhoids.  - The examination was otherwise normal on direct and retroflexion views.  Past Medical History:  Diagnosis Date   Anemia    COVID-19 11/2020   GERD (gastroesophageal reflux disease)    Headache    Hx of adenomatous polyp of colon 03/24/2021  Hypertension     Past Surgical History:  Procedure Laterality Date   BREAST LUMPECTOMY Right 1989, 2008   COLONOSCOPY  03/10/2021   Jennifer Landry   COLPOSCOPY  12/2011   HYSTERECTOMY ABDOMINAL WITH SALPINGECTOMY Bilateral 02/21/2017   Procedure: ABDOMINAL HYSTERECTOMY FOR UTERUS GREATER THAN 250GMS WITH RIGHT SALPINGECTOMY,   LEFT SALPINGOOPHERECTOMY;  Surgeon: Maurilio Ship, MD;  Location:  [facility];  Service: Gynecology;  Laterality: Bilateral;   POLYPECTOMY      Current Outpatient Medications  Medication Sig Dispense Refill   acetaminophen  (TYLENOL ) 500 MG tablet Take 1,000 mg by mouth every 6 (six) hours as needed (For pain.).     AMBULATORY NON FORMULARY MEDICATION Medication Name: collagen peptides daily     benzonatate  (TESSALON ) 100 MG capsule Take 1 capsule (100 mg total) by mouth 3 (three) times daily as needed for cough. Do not take with alcohol or while driving or operating heavy machinery.  May cause drowsiness. 21 capsule 0   brompheniramine-pseudoephedrine-DM 30-2-10 MG/5ML syrup Take 5 mLs by mouth 4 (four) times daily as needed. 140 mL 0   cetirizine  (ZYRTEC ) 10 MG tablet Take 1 tablet (10 mg total) by mouth daily. 30 tablet 0   Cholecalciferol (D3-1000 PO) Take 1 tablet by mouth daily.     Cyanocobalamin (VITAMIN B 12 PO) Take 1 tablet by mouth daily.     hydrocortisone  (ANUSOL -HC) 2.5 % rectal cream Place 1 application rectally 2 (two) times daily. 30 g 0   ibuprofen  (ADVIL ,MOTRIN ) 200 MG tablet Take 400 mg by mouth every 6 (six) hours as needed (for pain.).     losartan (COZAAR) 100 MG tablet Take 100 mg by mouth daily.     Multiple Vitamin (MULTIVITAMIN WITH MINERALS) TABS tablet Take 1 tablet by mouth at bedtime.     ondansetron  (ZOFRAN -ODT) 4 MG disintegrating tablet Take 1 tablet (4 mg total) by mouth every 8 (eight) hours as needed for nausea or vomiting. 20 tablet 0   Peppermint Oil (IBGARD PO) Take 2 tablets by mouth daily.     promethazine -dextromethorphan (PROMETHAZINE -DM) 6.25-15 MG/5ML syrup Take 5 mLs by mouth at bedtime as needed for cough. Do not take with alcohol or while driving or operating heavy machinery.  May cause drowsiness 118 mL 0   Zinc Sulfate (ZINC 15 PO) Take 1 tablet by mouth daily.     No current facility-administered medications for this visit.    Allergies as of 08/11/2024   (No Known Allergies)    Family History  Problem  Relation Age of Onset   Colon polyps Mother    Heart disease Mother    Colon polyps Father    Diabetes Father    Heart disease Father    Parkinson's disease Father    Heart disease Brother    Heart disease Brother    Colon cancer Maternal Grandmother    Other Maternal Grandfather        unknown   Heart disease Paternal Grandmother    Diabetes Paternal Grandmother    Heart disease Paternal Grandfather    Diabetes Paternal Grandfather    Esophageal cancer Neg Hx    Stomach cancer Neg Hx    Rectal cancer Neg Hx     Social History   Tobacco Use   Smoking status: Never   Smokeless tobacco: Never  Vaping Use   Vaping status: Never Used  Substance Use Topics   Alcohol use: No   Drug use: No     Review of Systems:  Constitutional: No unintentional weight loss, fever, chills, weakness Ears, Nose, Throat: Nasal congestion Cardiovascular: No chest pain, chest pressure or palpitations   Respiratory: No SOB or cough Gastrointestinal: See HPI and otherwise negative Hematologic: No bleeding or bruising    Physical Exam:  Vital signs: BP 130/80   Pulse 69   Ht 5' 1 (1.549 m)   Wt 190 lb 12.8 oz (86.5 kg)   LMP 01/30/2017 (Exact Date)   BMI 36.05 kg/m   Constitutional: Pleasant, obese female in NAD, alert and cooperative Head:  Normocephalic and atraumatic.  Eyes: No scleral icterus.  Respiratory: Respirations even and unlabored. Lungs clear to auscultation bilaterally.  No wheezes, crackles, or rhonchi.  Cardiovascular:  Regular rate and rhythm. No murmurs. No peripheral edema. Gastrointestinal:  Soft, nondistended, nontender. No rebound or guarding. Normal bowel sounds. No appreciable masses or hepatomegaly. Rectal:  Not performed.  Neurologic:  Alert and oriented x4;  grossly normal neurologically.  Skin:   Dry and intact without significant lesions or rashes. Psychiatric: Oriented to person, place and time. Demonstrates good judgement and reason without abnormal  affect or behaviors.   RELEVANT LABS AND IMAGING: CBC    Component Value Date/Time   WBC 11.2 (H) 02/22/2017 0423   RBC 3.96 02/22/2017 0423   HGB 9.0 (L) 02/22/2017 0423   HCT 29.2 (L) 02/22/2017 0423   PLT 215 02/22/2017 0423   MCV 73.7 (L) 02/22/2017 0423   MCH 22.7 (L) 02/22/2017 0423   MCHC 30.8 02/22/2017 0423   RDW 17.7 (H) 02/22/2017 0423   LYMPHSABS 1.3 02/13/2017 0954   MONOABS 0.4 02/13/2017 0954   EOSABS 0.1 02/13/2017 0954   BASOSABS 0.0 02/13/2017 0954    CMP     Component Value Date/Time   NA 138 02/22/2017 0423   K 3.8 02/22/2017 0423   CL 104 02/22/2017 0423   CO2 28 02/22/2017 0423   GLUCOSE 127 (H) 02/22/2017 0423   BUN 12 02/22/2017 0423   CREATININE 0.80 02/22/2017 0423   CALCIUM 8.1 (L) 02/22/2017 0423   PROT 6.5 02/13/2017 0954   ALBUMIN  3.8 02/13/2017 0954   AST 22 02/13/2017 0954   ALT 15 02/13/2017 0954   ALKPHOS 54 02/13/2017 0954   BILITOT 0.6 02/13/2017 0954   GFRNONAA >60 02/22/2017 0423   GFRAA >60 02/22/2017 0423     Assessment/Plan:   Heartburn Dysphagia History of adenomatous colon polyp Constipation Nasal congestion Patient seen today for complaint of 1 year of intermittent heartburn and dysphagia.  Has trouble swallowing certain foods like meat and corn and feels that things get stuck in her lower chest.  She has to regurgitate food and then is able to eat normally.  This happens about once a week.  Also having heartburn about once a week, particularly with foods like chocolate.  Symptoms relieved with Tums as needed.  Has not been on any antireflux medications.  No prior EGD.  She would like to discuss having both an upper and lower endoscopy. Due for repeat colonoscopy with history of 15 to 20 mm adenomatous colon polyp.  She has been having about 4 months of nasal congestion and is concerned about a nasal polyp.  Will be seeing ENT next week for further evaluation.  Has some trouble breathing through her nose but denies  feelings of shortness of breath or chest pain.  Follows with cardiology annually for hypertension and reports that on recent labs things are stable.  - Will tentatively schedule EGD with possible dilation/Colonoscopy. I  thoroughly discussed the procedure with the patient to include nature of the procedure, alternatives, benefits, and risks (including but not limited to bleeding, infection, perforation, anesthesia/cardiac/pulmonary complications). Patient verbalized understanding and gave verbal consent to proceed with procedure.  - Labs today: CBC, CMP - Start omeprazole  40 mg daily - Increase fluid intake to prevent constipation - Will discuss with Dr. Avram regarding patient's nasal congestion in light of upcoming endoscopic procedures.  Also advised patient to discuss this with ENT specialist when she sees them and let us  know if there are any concerns on their end about her undergoing procedure.   Camie Furbish, PA-C [facility] 08/11/2024, 8:50 AM  Patient Care Team: Patient, No Pcp Per as PCP - General (General Practice) Patient, No Pcp Per (General Practice)

## 2024-08-11 ENCOUNTER — Ambulatory Visit: Payer: Self-pay | Admitting: Gastroenterology

## 2024-08-11 ENCOUNTER — Ambulatory Visit: Admitting: Gastroenterology

## 2024-08-11 ENCOUNTER — Telehealth: Payer: Self-pay | Admitting: Gastroenterology

## 2024-08-11 ENCOUNTER — Other Ambulatory Visit

## 2024-08-11 ENCOUNTER — Encounter: Payer: Self-pay | Admitting: Gastroenterology

## 2024-08-11 VITALS — BP 130/80 | HR 69 | Ht 61.0 in | Wt 190.8 lb

## 2024-08-11 DIAGNOSIS — R1319 Other dysphagia: Secondary | ICD-10-CM

## 2024-08-11 DIAGNOSIS — K5900 Constipation, unspecified: Secondary | ICD-10-CM | POA: Diagnosis not present

## 2024-08-11 DIAGNOSIS — R1310 Dysphagia, unspecified: Secondary | ICD-10-CM | POA: Diagnosis not present

## 2024-08-11 DIAGNOSIS — R0981 Nasal congestion: Secondary | ICD-10-CM

## 2024-08-11 DIAGNOSIS — R12 Heartburn: Secondary | ICD-10-CM

## 2024-08-11 DIAGNOSIS — Z860101 Personal history of adenomatous and serrated colon polyps: Secondary | ICD-10-CM | POA: Diagnosis not present

## 2024-08-11 DIAGNOSIS — D509 Iron deficiency anemia, unspecified: Secondary | ICD-10-CM

## 2024-08-11 LAB — CBC WITH DIFFERENTIAL/PLATELET
Basophils Absolute: 0 K/uL (ref 0.0–0.1)
Basophils Relative: 0.5 % (ref 0.0–3.0)
Eosinophils Absolute: 0.1 K/uL (ref 0.0–0.7)
Eosinophils Relative: 1.8 % (ref 0.0–5.0)
HCT: 39.6 % (ref 36.0–46.0)
Hemoglobin: 12.6 g/dL (ref 12.0–15.0)
Lymphocytes Relative: 25 % (ref 12.0–46.0)
Lymphs Abs: 1.5 K/uL (ref 0.7–4.0)
MCHC: 31.8 g/dL (ref 30.0–36.0)
MCV: 75.2 fl — ABNORMAL LOW (ref 78.0–100.0)
Monocytes Absolute: 0.5 K/uL (ref 0.1–1.0)
Monocytes Relative: 8.5 % (ref 3.0–12.0)
Neutro Abs: 3.9 K/uL (ref 1.4–7.7)
Neutrophils Relative %: 64.2 % (ref 43.0–77.0)
Platelets: 248 K/uL (ref 150.0–400.0)
RBC: 5.26 Mil/uL — ABNORMAL HIGH (ref 3.87–5.11)
RDW: 15.9 % — ABNORMAL HIGH (ref 11.5–15.5)
WBC: 6.1 K/uL (ref 4.0–10.5)

## 2024-08-11 LAB — COMPREHENSIVE METABOLIC PANEL WITH GFR
ALT: 17 U/L (ref 0–35)
AST: 19 U/L (ref 0–37)
Albumin: 4.2 g/dL (ref 3.5–5.2)
Alkaline Phosphatase: 80 U/L (ref 39–117)
BUN: 17 mg/dL (ref 6–23)
CO2: 30 meq/L (ref 19–32)
Calcium: 9 mg/dL (ref 8.4–10.5)
Chloride: 100 meq/L (ref 96–112)
Creatinine, Ser: 0.74 mg/dL (ref 0.40–1.20)
GFR: 89.98 mL/min (ref >60.00)
Glucose, Bld: 106 mg/dL — ABNORMAL HIGH (ref 70–99)
Potassium: 3.7 meq/L (ref 3.5–5.1)
Sodium: 138 meq/L (ref 135–145)
Total Bilirubin: 0.5 mg/dL (ref 0.2–1.2)
Total Protein: 7 g/dL (ref 6.0–8.3)

## 2024-08-11 MED ORDER — OMEPRAZOLE 40 MG PO CPDR: 40.0000 mg | DELAYED_RELEASE_CAPSULE | Freq: Every day | ORAL | 2 refills | Status: AC

## 2024-08-11 MED ORDER — NA SULFATE-K SULFATE-MG SULF 17.5-3.13-1.6 GM/177ML PO SOLN: 1.0000 | Freq: Once | ORAL | 0 refills | Status: AC

## 2024-08-11 NOTE — Telephone Encounter (Signed)
 Patient rescheduled for Monday, 10-6 at 12:30pm. New instructions completed and sent via MyChart. Called patient and let her know

## 2024-08-11 NOTE — Patient Instructions (Addendum)
 You have been scheduled for an endoscopy and colonoscopy. Please follow the written instructions given to you at your visit today.  If you use inhalers (even only as needed), please bring them with you on the day of your procedure.  DO NOT TAKE 7 DAYS PRIOR TO TEST- Trulicity (dulaglutide) Ozempic, Wegovy (semaglutide) Mounjaro (tirzepatide) Bydureon Bcise (exanatide extended release)  DO NOT TAKE 1 DAY PRIOR TO YOUR TEST Rybelsus (semaglutide) Adlyxin (lixisenatide) Victoza (liraglutide) Byetta (exanatide) ___________________________________________________________________________   Jennifer Landry have been scheduled for a follow up visit on Wednesday, 10-8 at 9:00 am. Please call as soon as possible if you need yo reschedule this appointment:  [phone].  Please go to the lab in the basement of our building to have lab work done as you leave today. Hit B for basement when you get on the elevator.  When the doors open the lab is on your left.  We will call you with the results. Thank you.  We have sent the following medications to your pharmacy for you to pick up at your convenience: Omeprazole  40 mg : Take once daily  Thank you for entrusting me with your care and for choosing [employer], Camie Furbish, PA    _______________________________________________________  If your blood pressure at your visit was 140/90 or greater, please contact your primary care physician to follow up on this.  _______________________________________________________  If you are age 75 or older, your body mass index should be between 23-30. Your Body mass index is 36.05 kg/m. If this is out of the aforementioned range listed, please consider follow up with your Primary Care Provider.  If you are age 34 or younger, your body mass index should be between 19-25. Your Body mass index is 36.05 kg/m. If this is out of the aformentioned range listed, please consider follow up with your Primary Care Provider.    ________________________________________________________  The [facility] providers would like to encourage you to use MYCHART to communicate with providers for non-urgent requests or questions.  Due to long hold times on the telephone, sending your provider a message by [facility] may be a faster and more efficient way to get a response.  Please allow 48 business hours for a response.  Please remember that this is for non-urgent requests.  _______________________________________________________  [facility] is using a team-based approach to care.  Your team is made up of your doctor and two to three APPS. Our APPS (Nurse Practitioners and Physician Assistants) work with your physician to ensure care continuity for you. They are fully qualified to address your health concerns and develop a treatment plan. They communicate directly with your gastroenterologist to care for you. Seeing the Advanced Practice Practitioners on your physician's team can help you by facilitating care more promptly, often allowing for earlier appointments, access to diagnostic testing, procedures, and other specialty referrals.

## 2024-08-11 NOTE — Telephone Encounter (Addendum)
 Inbound call from patient requesting to have new prep instructions for rescheduled procedures for 10/6. Call back number is [phone]. Please advise, thank you

## 2024-08-12 NOTE — Telephone Encounter (Signed)
 Called and spoke to patient.  She said she can't come the week of August 25th but she will try to come on 8-22 early am.

## 2024-08-14 ENCOUNTER — Encounter: Admitting: Internal Medicine

## 2024-08-20 DIAGNOSIS — D140 Benign neoplasm of middle ear, nasal cavity and accessory sinuses: Secondary | ICD-10-CM | POA: Diagnosis not present

## 2024-08-20 DIAGNOSIS — J3489 Other specified disorders of nose and nasal sinuses: Secondary | ICD-10-CM | POA: Diagnosis not present

## 2024-08-21 ENCOUNTER — Other Ambulatory Visit

## 2024-08-21 DIAGNOSIS — J3489 Other specified disorders of nose and nasal sinuses: Secondary | ICD-10-CM | POA: Diagnosis not present

## 2024-08-21 DIAGNOSIS — R220 Localized swelling, mass and lump, head: Secondary | ICD-10-CM | POA: Diagnosis not present

## 2024-08-21 DIAGNOSIS — D509 Iron deficiency anemia, unspecified: Secondary | ICD-10-CM

## 2024-08-21 LAB — CBC WITH DIFFERENTIAL/PLATELET
Basophils Absolute: 0 K/uL (ref 0.0–0.1)
Basophils Relative: 0.6 % (ref 0.0–3.0)
Eosinophils Absolute: 0.1 K/uL (ref 0.0–0.7)
Eosinophils Relative: 2.2 % (ref 0.0–5.0)
HCT: 40 % (ref 36.0–46.0)
Hemoglobin: 12.8 g/dL (ref 12.0–15.0)
Lymphocytes Relative: 28.4 % (ref 12.0–46.0)
Lymphs Abs: 1.5 K/uL (ref 0.7–4.0)
MCHC: 31.9 g/dL (ref 30.0–36.0)
MCV: 74.4 fl — ABNORMAL LOW (ref 78.0–100.0)
Monocytes Absolute: 0.5 K/uL (ref 0.1–1.0)
Monocytes Relative: 9.2 % (ref 3.0–12.0)
Neutro Abs: 3.1 K/uL (ref 1.4–7.7)
Neutrophils Relative %: 59.6 % (ref 43.0–77.0)
Platelets: 253 K/uL (ref 150.0–400.0)
RBC: 5.37 Mil/uL — ABNORMAL HIGH (ref 3.87–5.11)
RDW: 15.9 % — ABNORMAL HIGH (ref 11.5–15.5)
WBC: 5.3 K/uL (ref 4.0–10.5)

## 2024-08-21 LAB — IBC + FERRITIN
Ferritin: 5.3 ng/mL — ABNORMAL LOW (ref 10.0–291.0)
Iron: 41 ug/dL — ABNORMAL LOW (ref 42–145)
Saturation Ratios: 9.2 % — ABNORMAL LOW (ref 20.0–50.0)
TIBC: 445.2 ug/dL (ref 250.0–450.0)
Transferrin: 318 mg/dL (ref 212.0–360.0)

## 2024-08-24 ENCOUNTER — Ambulatory Visit: Payer: Self-pay | Admitting: Gastroenterology

## 2024-09-02 ENCOUNTER — Encounter: Admitting: Internal Medicine

## 2024-09-16 DIAGNOSIS — J3489 Other specified disorders of nose and nasal sinuses: Secondary | ICD-10-CM | POA: Diagnosis not present

## 2024-09-28 DIAGNOSIS — J3489 Other specified disorders of nose and nasal sinuses: Secondary | ICD-10-CM | POA: Diagnosis not present

## 2024-09-28 DIAGNOSIS — J349 Unspecified disorder of nose and nasal sinuses: Secondary | ICD-10-CM | POA: Diagnosis not present

## 2024-10-04 NOTE — Progress Notes (Unsigned)
 [facility] History and Physical   Primary Care Physician:  Patient, No Pcp Per   Reason for Procedure:   Dysphagia, GERD and hx colon polyops  Plan:    EGD and colonoscopy     HPI: VICTORY DRESDEN is a 58 y.o. female seen in the clinic for dysphagia and hx colon polyp - EGD and colonoscopy arranged Flexible sigmoidoscopy 07/21/2021 - Hemorrhoids found on perianal exam.  - Post- polypectomy scar in the sigmoid colon. Biopsied.  - A tattoo was seen in the sigmoid colon.  - Severe diverticulosis in the sigmoid colon. There was narrowing of the colon in association with the diverticular opening.  - External and internal hemorrhoids.  - The examination was otherwise normal. - Recall 2025   Colonoscopy 03/10/2021 - Hemorrhoids found on perianal exam.  - One 15 to 20 mm polyp in the distal sigmoid colon, removed piecemeal using a hot snare. Resected and retrieved. Tattooed.  - Diverticulosis in the sigmoid colon.  - External and internal hemorrhoids.  - The examination was otherwise normal on direct and retroflexion views.    Past Medical History:  Diagnosis Date   Anemia    COVID-19 11/2020   GERD (gastroesophageal reflux disease)    Headache    Hx of adenomatous polyp of colon 03/24/2021   Hypertension     Past Surgical History:  Procedure Laterality Date   BREAST LUMPECTOMY Right 1989, 2008   COLONOSCOPY  03/10/2021   Talan Gildner   COLPOSCOPY  12/2011   HYSTERECTOMY ABDOMINAL WITH SALPINGECTOMY Bilateral 02/21/2017   Procedure: ABDOMINAL HYSTERECTOMY FOR UTERUS GREATER THAN 250GMS WITH RIGHT SALPINGECTOMY,   LEFT SALPINGOOPHERECTOMY;  Surgeon: Maurilio Ship, MD;  Location: [facility];  Service: Gynecology;  Laterality: Bilateral;   POLYPECTOMY       Current Outpatient Medications  Medication Sig Dispense Refill   acetaminophen  (TYLENOL ) 500 MG tablet Take 1,000 mg by mouth every 6 (six) hours as needed (For pain.).     AMBULATORY NON FORMULARY MEDICATION  Medication Name: collagen peptides daily     benzonatate  (TESSALON ) 100 MG capsule Take 1 capsule (100 mg total) by mouth 3 (three) times daily as needed for cough. Do not take with alcohol or while driving or operating heavy machinery.  May cause drowsiness. 21 capsule 0   brompheniramine-pseudoephedrine-DM 30-2-10 MG/5ML syrup Take 5 mLs by mouth 4 (four) times daily as needed. 140 mL 0   cetirizine  (ZYRTEC ) 10 MG tablet Take 1 tablet (10 mg total) by mouth daily. 30 tablet 0   Cholecalciferol (D3-1000 PO) Take 1 tablet by mouth daily.     Cyanocobalamin (VITAMIN B 12 PO) Take 1 tablet by mouth daily.     hydrocortisone  (ANUSOL -HC) 2.5 % rectal cream Place 1 application rectally 2 (two) times daily. 30 g 0   ibuprofen  (ADVIL ,MOTRIN ) 200 MG tablet Take 400 mg by mouth every 6 (six) hours as needed (for pain.).     losartan (COZAAR) 100 MG tablet Take 100 mg by mouth daily.     Multiple Vitamin (MULTIVITAMIN WITH MINERALS) TABS tablet Take 1 tablet by mouth at bedtime.     omeprazole  (PRILOSEC) 40 MG capsule Take 1 capsule (40 mg total) by mouth daily. 30 capsule 2   ondansetron  (ZOFRAN -ODT) 4 MG disintegrating tablet Take 1 tablet (4 mg total) by mouth every 8 (eight) hours as needed for nausea or vomiting. 20 tablet 0   Peppermint Oil (IBGARD PO) Take 2 tablets by mouth daily.     promethazine -dextromethorphan (PROMETHAZINE -DM)  6.25-15 MG/5ML syrup Take 5 mLs by mouth at bedtime as needed for cough. Do not take with alcohol or while driving or operating heavy machinery.  May cause drowsiness 118 mL 0   Zinc Sulfate (ZINC 15 PO) Take 1 tablet by mouth daily.     No current facility-administered medications for this visit.    Allergies as of 10/05/2024   (No Known Allergies)    Family History  Problem Relation Age of Onset   Colon polyps Mother    Heart disease Mother    Colon polyps Father    Diabetes Father    Heart disease Father    Parkinson's disease Father    Heart disease  Brother    Heart disease Brother    Colon cancer Maternal Grandmother    Other Maternal Grandfather        unknown   Heart disease Paternal Grandmother    Diabetes Paternal Grandmother    Heart disease Paternal Grandfather    Diabetes Paternal Grandfather    Esophageal cancer Neg Hx    Stomach cancer Neg Hx    Rectal cancer Neg Hx     Social History   Socioeconomic History   Marital status: Married    Spouse name: Not on file   Number of children: 3   Years of education: Not on file   Highest education level: Not on file  Occupational History   Not on file  Tobacco Use   Smoking status: Never   Smokeless tobacco: Never  Vaping Use   Vaping status: Never Used  Substance and Sexual Activity   Alcohol use: No   Drug use: No   Sexual activity: Not Currently  Other Topics Concern   Not on file  Social History Narrative   Not on file   Social Drivers of Health   Financial Resource Strain: Not on file  Food Insecurity: Not on file  Transportation Needs: Not on file  Physical Activity: Not on file  Stress: Not on file  Social Connections: Not on file  Intimate Partner Violence: Not on file    Review of Systems: Positive for *** All other review of systems negative except as mentioned in the HPI.  Physical Exam: Vital signs LMP 01/30/2017 (Exact Date)   General:   Alert,  Well-developed, well-nourished, pleasant and cooperative in NAD Lungs:  Clear throughout to auscultation.   Heart:  Regular rate and rhythm; no murmurs, clicks, rubs,  or gallops. Abdomen:  Soft, nontender and nondistended. Normal bowel sounds.   Neuro/Psych:  Alert and cooperative. Normal mood and affect. A and O x 3   @[place]  CHARLENA Commander, MD, [facility] [phone] (pager) 10/04/2024 8:22 PM@

## 2024-10-05 ENCOUNTER — Encounter: Payer: Self-pay | Admitting: Internal Medicine

## 2024-10-05 ENCOUNTER — Ambulatory Visit (AMBULATORY_SURGERY_CENTER): Admitting: Internal Medicine

## 2024-10-05 VITALS — BP 124/87 | HR 72 | Temp 98.4°F | Resp 17 | Ht 61.0 in | Wt 190.0 lb

## 2024-10-05 DIAGNOSIS — K644 Residual hemorrhoidal skin tags: Secondary | ICD-10-CM | POA: Diagnosis not present

## 2024-10-05 DIAGNOSIS — R1310 Dysphagia, unspecified: Secondary | ICD-10-CM | POA: Diagnosis not present

## 2024-10-05 DIAGNOSIS — L818 Other specified disorders of pigmentation: Secondary | ICD-10-CM

## 2024-10-05 DIAGNOSIS — Z860101 Personal history of adenomatous and serrated colon polyps: Secondary | ICD-10-CM

## 2024-10-05 DIAGNOSIS — Z1211 Encounter for screening for malignant neoplasm of colon: Secondary | ICD-10-CM | POA: Diagnosis not present

## 2024-10-05 DIAGNOSIS — R1319 Other dysphagia: Secondary | ICD-10-CM

## 2024-10-05 DIAGNOSIS — K5730 Diverticulosis of large intestine without perforation or abscess without bleeding: Secondary | ICD-10-CM

## 2024-10-05 DIAGNOSIS — K219 Gastro-esophageal reflux disease without esophagitis: Secondary | ICD-10-CM

## 2024-10-05 DIAGNOSIS — K317 Polyp of stomach and duodenum: Secondary | ICD-10-CM

## 2024-10-05 DIAGNOSIS — R12 Heartburn: Secondary | ICD-10-CM

## 2024-10-05 DIAGNOSIS — K648 Other hemorrhoids: Secondary | ICD-10-CM | POA: Diagnosis not present

## 2024-10-05 DIAGNOSIS — K222 Esophageal obstruction: Secondary | ICD-10-CM | POA: Diagnosis not present

## 2024-10-05 MED ORDER — SODIUM CHLORIDE 0.9 % IV SOLN: 500.0000 mL | Freq: Once | INTRAVENOUS | Status: DC

## 2024-10-05 NOTE — Op Note (Signed)
 [facility] Patient Name: Jennifer Landry Procedure Date: 10/05/2024 12:09 PM MRN: [id number] Endoscopist: Lupita FORBES Commander , MD, [id number] Age: 58 Referring MD:  Date of Birth: November 11, 1966 Gender: Female Account #: [id number] Procedure:                Colonoscopy Indications:              High risk colon cancer surveillance: Personal                            history of colonic polyps, Last colonoscopy: March                            2022 Medicines:                Monitored Anesthesia Care Procedure:                Pre-Anesthesia Assessment:                           - Prior to the procedure, a History and Physical                            was performed, and patient medications and                            allergies were reviewed. The patient's tolerance of                            previous anesthesia was also reviewed. The risks                            and benefits of the procedure and the sedation                            options and risks were discussed with the patient.                            All questions were answered, and informed consent                            was obtained. Prior Anticoagulants: The patient has                            taken no anticoagulant or antiplatelet agents. ASA                            Grade Assessment: II - A patient with mild systemic                            disease. After reviewing the risks and benefits,                            the patient was deemed in satisfactory condition to  undergo the procedure.                           After obtaining informed consent, the colonoscope                            was passed under direct vision. Throughout the                            procedure, the patient's blood pressure, pulse, and                            oxygen saturations were monitored continuously. The                            Olympus Scope [id number] was introduced through the                             anus and advanced to the the cecum, identified by                            appendiceal orifice and ileocecal valve. The                            colonoscopy was performed without difficulty. The                            patient tolerated the procedure well. The quality                            of the bowel preparation was good. The ileocecal                            valve, appendiceal orifice, and rectum were                            photographed. Scope In: 1:59:39 PM Scope Out: 2:10:17 PM Scope Withdrawal Time: 0 hours 8 minutes 21 seconds  Total Procedure Duration: 0 hours 10 minutes 38 seconds  Findings:                 Hemorrhoids were found on perianal exam.                           Multiple large-mouthed, medium-mouthed and                            small-mouthed diverticula were found in the sigmoid                            colon. There was narrowing of the colon in                            association with the diverticular opening.  External and internal hemorrhoids were found. The                            hemorrhoids were large.                           A tattoo was seen in the sigmoid colon.                           The exam was otherwise without abnormality on                            direct and retroflexion views. Complications:            No immediate complications. Estimated Blood Loss:     Estimated blood loss: none. Impression:               - Hemorrhoids found on perianal exam. Prolapsed                            external +/- internal component                           - Severe diverticulosis in the sigmoid colon. There                            was narrowing of the colon in association with the                            diverticular opening.                           - A tattoo was seen in the sigmoid colon. Site of                            15-20 mm adenoma removed 2022 - no signs of polyp                            - The examination was otherwise normal on direct                            and retroflexion views.                           - No specimens collected. Recommendation:           - Patient has a contact number available for                            emergencies. The signs and symptoms of potential                            delayed complications were discussed with the                            patient. Return to normal activities  tomorrow.                            Written discharge instructions were provided to the                            patient.                           - Clear liquids x 1 hour then soft foods rest of                            day. Start prior diet tomorrow.                           - Patient has a contact number available for                            emergencies. The signs and symptoms of potential                            delayed complications were discussed with the                            patient. Return to normal activities tomorrow.                            Written discharge instructions were provided to the                            patient.                           - Continue present medications.                           - Repeat colonoscopy in 5 years. Lupita FORBES Commander, MD 10/05/2024 2:29:36 PM This report has been signed electronically.

## 2024-10-05 NOTE — Patient Instructions (Addendum)
 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  You should be able to swallow better.  There are some small stomach polyps that look benign I checked them with biopsies but I am not concerned.  Colonoscopy was good, you do have diverticulosis as before and your hemorrhoids were swollen as I think you know.  However no polyps this time.  Repeat exam in 5 years.  Good luck with your upcoming nasal issues.  I appreciate the opportunity to care for you. Jennifer CHARLENA Commander, MD, FACG YOU HAD AN ENDOSCOPIC PROCEDURE TODAY AT THE [facility]:   Refer to the procedure report that was given to you for any specific questions about what was found during the examination.  If the procedure report does not answer your questions, please call your gastroenterologist to clarify.  If you requested that your care partner not be given the details of your procedure findings, then the procedure report has been included in a sealed envelope for you to review at your convenience later.  YOU SHOULD EXPECT: Some feelings of bloating in the abdomen. Passage of more gas than usual.  Walking can help get rid of the air that was put into your GI tract during the procedure and reduce the bloating. If you had a lower endoscopy (such as a colonoscopy or flexible sigmoidoscopy) you may notice spotting of blood in your stool or on the toilet paper. If you underwent a bowel prep for your procedure, you may not have a normal bowel movement for a few days.  Please Note:  You might notice some irritation and congestion in your nose or some drainage.  This is from the oxygen used during your procedure.  There is no need for concern and it should clear up in a day or so.  SYMPTOMS TO REPORT IMMEDIATELY:  Following lower endoscopy (colonoscopy or flexible sigmoidoscopy):  Excessive amounts of blood in the stool  Significant tenderness or worsening of abdominal pains  Swelling of the abdomen that is new, acute  Fever of  100F or higher  Following upper endoscopy (EGD)  Vomiting of blood or coffee ground material  New chest pain or pain under the shoulder blades  Painful or persistently difficult swallowing  New shortness of breath  Fever of 100F or higher  Black, tarry-looking stools  For urgent or emergent issues, a gastroenterologist can be reached at any hour by calling [phone]. Do not use MyChart messaging for urgent concerns.    DIET:  We do recommend a small meal at first, but then you may proceed to your regular diet.  Drink plenty of fluids but you should avoid alcoholic beverages for 24 hours.  ACTIVITY:  You should plan to take it easy for the rest of today and you should NOT DRIVE or use heavy machinery until tomorrow (because of the sedation medicines used during the test).    FOLLOW UP: Our staff will call the number listed on your records the next business day following your procedure.  We will call around 7:15- 8:00 am to check on you and address any questions or concerns that you may have regarding the information given to you following your procedure. If we do not reach you, we will leave a message.     If any biopsies were taken you will be contacted by phone or by letter within the next 1-3 weeks.  Please call us  at [phone] if you have not heard about the biopsies in 3  weeks.    SIGNATURES/CONFIDENTIALITY: You and/or your care partner have signed paperwork which will be entered into your electronic medical record.  These signatures attest to the fact that that the information above on your After Visit Summary has been reviewed and is understood.  Full responsibility of the confidentiality of this discharge information lies with you and/or your care-partner.

## 2024-10-05 NOTE — Progress Notes (Signed)
 1350 BP 177/112, Labetalol given IV, MD update, vss

## 2024-10-05 NOTE — Op Note (Signed)
 [facility] Patient Name: Jennifer Landry Procedure Date: 10/05/2024 1:04 PM MRN: [id number] Endoscopist: Lupita FORBES Commander , MD, [id number] Age: 58 Referring MD:  Date of Birth: 16-Sep-1966 Gender: Female Account #: [id number] Procedure:                Upper GI endoscopy Indications:              Dysphagia, Heartburn Medicines:                Monitored Anesthesia Care Procedure:                Pre-Anesthesia Assessment:                           - Prior to the procedure, a History and Physical                            was performed, and patient medications and                            allergies were reviewed. The patient's tolerance of                            previous anesthesia was also reviewed. The risks                            and benefits of the procedure and the sedation                            options and risks were discussed with the patient.                            All questions were answered, and informed consent                            was obtained. Prior Anticoagulants: The patient has                            taken no anticoagulant or antiplatelet agents. ASA                            Grade Assessment: II - A patient with mild systemic                            disease. After reviewing the risks and benefits,                            the patient was deemed in satisfactory condition to                            undergo the procedure.                           After obtaining informed consent, the endoscope was  passed under direct vision. Throughout the                            procedure, the patient's blood pressure, pulse, and                            oxygen saturations were monitored continuously. The                            GIF [id number] #[id number] was introduced through the                            mouth, and advanced to the second part of duodenum.                            The upper GI endoscopy was  accomplished without                            difficulty. The patient tolerated the procedure                            well. Scope In: Scope Out: Findings:                 One benign-appearing, intrinsic moderate                            (circumferential scarring or stenosis; an endoscope                            may pass) stenosis was found at the                            gastroesophageal junction. The stenosis was                            traversed. A TTS dilator was passed through the                            scope. Dilation with an 18-19-20 mm balloon dilator                            was performed to 20 mm. The dilation site was                            examined and showed moderate mucosal disruption and                            moderate improvement in luminal narrowing.                            Estimated blood loss was minimal.                           Multiple diminutive sessile polyps were found in  the gastric fundus and in the gastric body.                            Biopsies were taken with a cold forceps for                            histology. Verification of patient identification                            for the specimen was done. Estimated blood loss was                            minimal.                           The exam was otherwise without abnormality.                           The cardia and gastric fundus were otherwise normal                            on retroflexion. Complications:            No immediate complications. Estimated Blood Loss:     Estimated blood loss was minimal. Impression:               - Benign-appearing esophageal stenosis. Dilated.                           - Multiple gastric polyps. Biopsied. Look like                            fundic gland polyps                           - The examination was otherwise normal. Recommendation:           - Patient has a contact number available for                             emergencies. The signs and symptoms of potential                            delayed complications were discussed with the                            patient. Return to normal activities tomorrow.                            Written discharge instructions were provided to the                            patient.                           - Clear liquids x 1 hour then soft foods rest of  day. Start prior diet tomorrow.                           - Continue present medications. Stay on omeprazole                             40 mg qd                           - Await pathology results.                           - See the other procedure note for documentation of                            additional recommendations. Lupita FORBES Commander, MD 10/05/2024 2:24:36 PM This report has been signed electronically.

## 2024-10-05 NOTE — Progress Notes (Signed)
1334 Robinul 0.1 mg IV given due large amount of secretions upon assessment.  MD made aware, vss  

## 2024-10-05 NOTE — Progress Notes (Signed)
 Called to room to assist during endoscopic procedure.  Patient ID and intended procedure confirmed with present staff. Received instructions for my participation in the procedure from the performing physician.

## 2024-10-05 NOTE — Progress Notes (Signed)
 Report given to PACU, vss

## 2024-10-06 ENCOUNTER — Telehealth: Payer: Self-pay | Admitting: *Deleted

## 2024-10-06 DIAGNOSIS — J3489 Other specified disorders of nose and nasal sinuses: Secondary | ICD-10-CM | POA: Diagnosis not present

## 2024-10-06 DIAGNOSIS — J324 Chronic pansinusitis: Secondary | ICD-10-CM | POA: Diagnosis not present

## 2024-10-06 NOTE — Telephone Encounter (Signed)
  Follow up Call-     10/05/2024   12:28 PM  Call back number  Post procedure Call Back phone  # [phone]  Permission to leave phone message Yes     Patient questions:  Do you have a fever, pain , or abdominal swelling? No. Pain Score  0 *  Have you tolerated food without any problems? Yes.    Have you been able to return to your normal activities? Yes.    Do you have any questions about your discharge instructions: Diet   No. Medications  No. Follow up visit  No.  Do you have questions or concerns about your Care? No.  Actions: * If pain score is 4 or above: No action needed, pain <4.

## 2024-10-06 NOTE — Progress Notes (Unsigned)
 Jennifer Landry [id number] 02-26-1966   Chief Complaint:  Referring Provider: No ref. provider found Primary GI MD: Dr. Avram  HPI: Jennifer Landry is a 58 y.o. female with past medical history of anemia, GERD, adenomatous colon polyps, HTN, abdominal hysterectomy, right breast lumpectomy who presents today for a complaint of *** .    Seen in office 01/09/2021 by Dr. Avram to discuss screening colonoscopy.  At that time endorsed lifelong history of mild alternating bowel habits consistent with IBS, self diagnosed.  Reported moving her bowels daily with occasional loose stools.  Overall symptoms are chronic and stable.  Endorsed occasional BRBPR with wiping only and associated with prolapse/swollen hemorrhoids, problem since childbirth.  Maternal grandmother had colon cancer.   Had a 15 to 20 mm adenomatous colon polyp removed on colonoscopy in 2022, with follow-up sigmoidoscopy 4 months later.  Last visit 08/11/2024 at which time patient endorsed 1 year of intermittent heartburn and dysphagia.  Was due for repeat colonoscopy and scheduled for both EGD and colonoscopy at that time.  Started on omeprazole  40 mg daily and advised to increase fluid intake to prevent constipation.  On labs found to have low MCV and endorsed history of iron deficiency.  Hemoglobin 12.6.  Repeat labs 08/21/2024 showed iron deficiency and she was advised to start OTC iron supplement.  She underwent EGD/colonoscopy 10/05/2024.  On colonoscopy found to have hemorrhoids, severe sigmoid diverticulosis, tattoo at site of previous large polyp with no evidence of polyp, and recommended recall in 5 years.  On EGD found to have a benign-appearing esophageal stenosis which was dilated and multiple gastric polyps which were biopsied.  Pathology results pending.     Previous GI Procedures/Imaging   Colonoscopy 10/05/2024 - Hemorrhoids found on perianal exam. Prolapsed external + / - internal component  - Severe  diverticulosis in the sigmoid colon. There was narrowing of the colon in association with the diverticular opening.  - A tattoo was seen in the sigmoid colon. Site of 15- 20 mm adenoma removed 2022 - no signs of polyp  - The examination was otherwise normal on direct and retroflexion views.  - No specimens collected. - Recall 5 years  EGD 10/05/2024 - Benign- appearing esophageal stenosis. Dilated.  - Multiple gastric polyps. Biopsied. Look like fundic gland polyps  - The examination was otherwise normal.  Flexible sigmoidoscopy 07/21/2021 - Hemorrhoids found on perianal exam.  - Post- polypectomy scar in the sigmoid colon. Biopsied.  - A tattoo was seen in the sigmoid colon.  - Severe diverticulosis in the sigmoid colon. There was narrowing of the colon in association with the diverticular opening.  - External and internal hemorrhoids.  - The examination was otherwise normal. - Recall 2025   Colonoscopy 03/10/2021 - Hemorrhoids found on perianal exam.  - One 15 to 20 mm polyp in the distal sigmoid colon, removed piecemeal using a hot snare. Resected and retrieved. Tattooed.  - Diverticulosis in the sigmoid colon.  - External and internal hemorrhoids.  - The examination was otherwise normal on direct and retroflexion views.  Past Medical History:  Diagnosis Date   Anemia    Anxiety    COVID-19 11/2020   GERD (gastroesophageal reflux disease)    Headache    Hx of adenomatous polyp of colon 03/24/2021   Hypertension     Past Surgical History:  Procedure Laterality Date   BREAST LUMPECTOMY Right 1989, 2008   COLONOSCOPY  03/10/2021   Avram   COLPOSCOPY  12/2011  HYSTERECTOMY ABDOMINAL WITH SALPINGECTOMY Bilateral 02/21/2017   Procedure: ABDOMINAL HYSTERECTOMY FOR UTERUS GREATER THAN 250GMS WITH RIGHT SALPINGECTOMY,   LEFT SALPINGOOPHERECTOMY;  Surgeon: Maurilio Ship, MD;  Location: [facility];  Service: Gynecology;  Laterality: Bilateral;   POLYPECTOMY      Current Outpatient  Medications  Medication Sig Dispense Refill   acetaminophen  (TYLENOL ) 500 MG tablet Take 1,000 mg by mouth every 6 (six) hours as needed (For pain.).     AMBULATORY NON FORMULARY MEDICATION Medication Name: collagen peptides daily     benzonatate  (TESSALON ) 100 MG capsule Take 1 capsule (100 mg total) by mouth 3 (three) times daily as needed for cough. Do not take with alcohol or while driving or operating heavy machinery.  May cause drowsiness. 21 capsule 0   brompheniramine-pseudoephedrine-DM 30-2-10 MG/5ML syrup Take 5 mLs by mouth 4 (four) times daily as needed. 140 mL 0   cetirizine  (ZYRTEC ) 10 MG tablet Take 1 tablet (10 mg total) by mouth daily. 30 tablet 0   Cholecalciferol (D3-1000 PO) Take 1 tablet by mouth daily.     Cyanocobalamin (VITAMIN B 12 PO) Take 1 tablet by mouth daily.     hydrocortisone  (ANUSOL -HC) 2.5 % rectal cream Place 1 application rectally 2 (two) times daily. 30 g 0   ibuprofen  (ADVIL ,MOTRIN ) 200 MG tablet Take 400 mg by mouth every 6 (six) hours as needed (for pain.).     losartan (COZAAR) 100 MG tablet Take 100 mg by mouth daily.     Multiple Vitamin (MULTIVITAMIN WITH MINERALS) TABS tablet Take 1 tablet by mouth at bedtime.     omeprazole  (PRILOSEC) 40 MG capsule Take 1 capsule (40 mg total) by mouth daily. 30 capsule 2   ondansetron  (ZOFRAN -ODT) 4 MG disintegrating tablet Take 1 tablet (4 mg total) by mouth every 8 (eight) hours as needed for nausea or vomiting. 20 tablet 0   Peppermint Oil (IBGARD PO) Take 2 tablets by mouth daily.     promethazine -dextromethorphan (PROMETHAZINE -DM) 6.25-15 MG/5ML syrup Take 5 mLs by mouth at bedtime as needed for cough. Do not take with alcohol or while driving or operating heavy machinery.  May cause drowsiness 118 mL 0   Zinc Sulfate (ZINC 15 PO) Take 1 tablet by mouth daily.     No current facility-administered medications for this visit.    Allergies as of 10/07/2024   (No Known Allergies)    Family History  Problem  Relation Age of Onset   Colon polyps Mother    Heart disease Mother    Colon polyps Father    Diabetes Father    Heart disease Father    Parkinson's disease Father    Heart disease Brother    Heart disease Brother    Colon cancer Maternal Grandmother    Other Maternal Grandfather        unknown   Heart disease Paternal Grandmother    Diabetes Paternal Grandmother    Heart disease Paternal Grandfather    Diabetes Paternal Grandfather    Esophageal cancer Neg Hx    Stomach cancer Neg Hx    Rectal cancer Neg Hx     Social History   Tobacco Use   Smoking status: Never   Smokeless tobacco: Never  Vaping Use   Vaping status: Never Used  Substance Use Topics   Alcohol use: No   Drug use: No     Review of Systems:    Constitutional: No weight loss, fever, chills, weakness or fatigue Eyes: No change in vision  Ears, Nose, Throat:  No change in hearing or congestion Skin: No rash or itching Cardiovascular: No chest pain, chest pressure or palpitations   Respiratory: No SOB or cough Gastrointestinal: See HPI and otherwise negative Genitourinary: No dysuria or change in urinary frequency Neurological: No headache, dizziness or syncope Musculoskeletal: No new muscle or joint pain Hematologic: No bleeding or bruising    Physical Exam:  Vital signs: LMP 01/30/2017 (Exact Date)   Constitutional: NAD, Well developed, Well nourished, alert and cooperative Head:  Normocephalic and atraumatic.  Eyes: No scleral icterus. Conjunctiva pink. Mouth: No oral lesions. Respiratory: Respirations even and unlabored. Lungs clear to auscultation bilaterally.  No wheezes, crackles, or rhonchi.  Cardiovascular:  Regular rate and rhythm. No murmurs. No peripheral edema. Gastrointestinal:  Soft, nondistended, nontender. No rebound or guarding. Normal bowel sounds. No appreciable masses or hepatomegaly. Rectal:  Not performed.  Neurologic:  Alert and oriented x4;  grossly normal neurologically.   Skin:   Dry and intact without significant lesions or rashes. Psychiatric: Oriented to person, place and time. Demonstrates good judgement and reason without abnormal affect or behaviors.   RELEVANT LABS AND IMAGING: CBC    Component Value Date/Time   WBC 5.3 08/21/2024 0802   RBC 5.37 (H) 08/21/2024 0802   HGB 12.8 08/21/2024 0802   HCT 40.0 08/21/2024 0802   PLT 253.0 08/21/2024 0802   MCV 74.4 (L) 08/21/2024 0802   MCH 22.7 (L) 02/22/2017 0423   MCHC 31.9 08/21/2024 0802   RDW 15.9 (H) 08/21/2024 0802   LYMPHSABS 1.5 08/21/2024 0802   MONOABS 0.5 08/21/2024 0802   EOSABS 0.1 08/21/2024 0802   BASOSABS 0.0 08/21/2024 0802    CMP     Component Value Date/Time   NA 138 08/11/2024 0932   K 3.7 08/11/2024 0932   CL 100 08/11/2024 0932   CO2 30 08/11/2024 0932   GLUCOSE 106 (H) 08/11/2024 0932   BUN 17 08/11/2024 0932   CREATININE 0.74 08/11/2024 0932   CALCIUM 9.0 08/11/2024 0932   PROT 7.0 08/11/2024 0932   ALBUMIN  4.2 08/11/2024 0932   AST 19 08/11/2024 0932   ALT 17 08/11/2024 0932   ALKPHOS 80 08/11/2024 0932   BILITOT 0.5 08/11/2024 0932   GFRNONAA >60 02/22/2017 0423   GFRAA >60 02/22/2017 0423     Assessment/Plan:    Repeat labs today   Camie Furbish, PA-C [facility] 10/06/2024, 6:59 PM  Patient Care Team: Patient, No Pcp Per as PCP - General (General Practice) Patient, No Pcp Per (General Practice)

## 2024-10-07 ENCOUNTER — Encounter: Payer: Self-pay | Admitting: Gastroenterology

## 2024-10-07 ENCOUNTER — Ambulatory Visit: Admitting: Gastroenterology

## 2024-10-07 ENCOUNTER — Ambulatory Visit: Payer: Self-pay | Admitting: Gastroenterology

## 2024-10-07 ENCOUNTER — Other Ambulatory Visit (INDEPENDENT_AMBULATORY_CARE_PROVIDER_SITE_OTHER)

## 2024-10-07 VITALS — BP 128/86 | HR 68 | Ht 61.0 in | Wt 190.0 lb

## 2024-10-07 DIAGNOSIS — K222 Esophageal obstruction: Secondary | ICD-10-CM

## 2024-10-07 DIAGNOSIS — R1319 Other dysphagia: Secondary | ICD-10-CM

## 2024-10-07 DIAGNOSIS — E611 Iron deficiency: Secondary | ICD-10-CM | POA: Diagnosis not present

## 2024-10-07 DIAGNOSIS — R1310 Dysphagia, unspecified: Secondary | ICD-10-CM

## 2024-10-07 DIAGNOSIS — R12 Heartburn: Secondary | ICD-10-CM

## 2024-10-07 DIAGNOSIS — K317 Polyp of stomach and duodenum: Secondary | ICD-10-CM | POA: Diagnosis not present

## 2024-10-07 DIAGNOSIS — K219 Gastro-esophageal reflux disease without esophagitis: Secondary | ICD-10-CM

## 2024-10-07 DIAGNOSIS — Z8719 Personal history of other diseases of the digestive system: Secondary | ICD-10-CM

## 2024-10-07 DIAGNOSIS — Z860101 Personal history of adenomatous and serrated colon polyps: Secondary | ICD-10-CM

## 2024-10-07 LAB — CBC WITH DIFFERENTIAL/PLATELET
Basophils Absolute: 0 K/uL (ref 0.0–0.1)
Basophils Relative: 0.5 % (ref 0.0–3.0)
Eosinophils Absolute: 0.1 K/uL (ref 0.0–0.7)
Eosinophils Relative: 2.3 % (ref 0.0–5.0)
HCT: 40.5 % (ref 36.0–46.0)
Hemoglobin: 12.8 g/dL (ref 12.0–15.0)
Lymphocytes Relative: 24.9 % (ref 12.0–46.0)
Lymphs Abs: 1.4 K/uL (ref 0.7–4.0)
MCHC: 31.6 g/dL (ref 30.0–36.0)
MCV: 75.4 fl — ABNORMAL LOW (ref 78.0–100.0)
Monocytes Absolute: 0.6 K/uL (ref 0.1–1.0)
Monocytes Relative: 9.7 % (ref 3.0–12.0)
Neutro Abs: 3.6 K/uL (ref 1.4–7.7)
Neutrophils Relative %: 62.6 % (ref 43.0–77.0)
Platelets: 242 K/uL (ref 150.0–400.0)
RBC: 5.37 Mil/uL — ABNORMAL HIGH (ref 3.87–5.11)
RDW: 18.7 % — ABNORMAL HIGH (ref 11.5–15.5)
WBC: 5.8 K/uL (ref 4.0–10.5)

## 2024-10-07 LAB — IBC + FERRITIN
Ferritin: 6.7 ng/mL — ABNORMAL LOW (ref 10.0–291.0)
Iron: 47 ug/dL (ref 42–145)
Saturation Ratios: 10.7 % — ABNORMAL LOW (ref 20.0–50.0)
TIBC: 439.6 ug/dL (ref 250.0–450.0)
Transferrin: 314 mg/dL (ref 212.0–360.0)

## 2024-10-07 NOTE — Patient Instructions (Signed)
 Your provider has requested that you go to the basement level for lab work before leaving today. Press B on the elevator. The lab is located at the first door on the left as you exit the elevator.  _______________________________________________________  If your blood pressure at your visit was 140/90 or greater, please contact your primary care physician to follow up on this.  _______________________________________________________  If you are age 57 or older, your body mass index should be between 23-30. Your Body mass index is 35.9 kg/m. If this is out of the aforementioned range listed, please consider follow up with your Primary Care Provider.  If you are age 32 or younger, your body mass index should be between 19-25. Your Body mass index is 35.9 kg/m. If this is out of the aformentioned range listed, please consider follow up with your Primary Care Provider.   ________________________________________________________  The [facility] providers would like to encourage you to use MYCHART to communicate with providers for non-urgent requests or questions.  Due to long hold times on the telephone, sending your provider a message by [facility] may be a faster and more efficient way to get a response.  Please allow 48 business hours for a response.  Please remember that this is for non-urgent requests.  _______________________________________________________  [facility] is using a team-based approach to care.  Your team is made up of your doctor and two to three APPS. Our APPS (Nurse Practitioners and Physician Assistants) work with your physician to ensure care continuity for you. They are fully qualified to address your health concerns and develop a treatment plan. They communicate directly with your gastroenterologist to care for you. Seeing the Advanced Practice Practitioners on your physician's team can help you by facilitating care more promptly, often allowing for earlier  appointments, access to diagnostic testing, procedures, and other specialty referrals.   Due to recent changes in healthcare laws, you may see the results of your imaging and laboratory studies on MyChart before your provider has had a chance to review them.  We understand that in some cases there may be results that are confusing or concerning to you. Not all laboratory results come back in the same time frame and the provider may be waiting for multiple results in order to interpret others.  Please give us  48 hours in order for your provider to thoroughly review all the results before contacting the office for clarification of your results.   Thank you for choosing me and  Gastroenterology.  Sara Heinz,PA-C

## 2024-10-08 LAB — SURGICAL PATHOLOGY

## 2024-10-16 ENCOUNTER — Ambulatory Visit: Payer: Self-pay | Admitting: Internal Medicine

## 2024-11-20 DIAGNOSIS — R7303 Prediabetes: Secondary | ICD-10-CM | POA: Diagnosis not present

## 2024-11-20 DIAGNOSIS — I10 Essential (primary) hypertension: Secondary | ICD-10-CM | POA: Diagnosis not present

## 2024-11-20 DIAGNOSIS — E669 Obesity, unspecified: Secondary | ICD-10-CM | POA: Diagnosis not present

## 2024-11-20 DIAGNOSIS — E782 Mixed hyperlipidemia: Secondary | ICD-10-CM | POA: Diagnosis not present

## 2024-11-24 DIAGNOSIS — D140 Benign neoplasm of middle ear, nasal cavity and accessory sinuses: Secondary | ICD-10-CM | POA: Diagnosis not present

## 2024-11-24 DIAGNOSIS — J3489 Other specified disorders of nose and nasal sinuses: Secondary | ICD-10-CM | POA: Diagnosis not present

## 2024-11-24 DIAGNOSIS — J324 Chronic pansinusitis: Secondary | ICD-10-CM | POA: Diagnosis not present

## 2024-11-24 DIAGNOSIS — D385 Neoplasm of uncertain behavior of other respiratory organs: Secondary | ICD-10-CM | POA: Diagnosis not present

## 2024-11-24 DIAGNOSIS — D259 Leiomyoma of uterus, unspecified: Secondary | ICD-10-CM | POA: Diagnosis not present

## 2024-11-24 DIAGNOSIS — D649 Anemia, unspecified: Secondary | ICD-10-CM | POA: Diagnosis not present

## 2024-11-30 DIAGNOSIS — J3489 Other specified disorders of nose and nasal sinuses: Secondary | ICD-10-CM | POA: Diagnosis not present

## 2024-11-30 DIAGNOSIS — Z9889 Other specified postprocedural states: Secondary | ICD-10-CM | POA: Diagnosis not present

## 2024-11-30 DIAGNOSIS — J324 Chronic pansinusitis: Secondary | ICD-10-CM | POA: Diagnosis not present

## 2024-12-14 DIAGNOSIS — J3489 Other specified disorders of nose and nasal sinuses: Secondary | ICD-10-CM | POA: Diagnosis not present

## 2024-12-14 DIAGNOSIS — Z9889 Other specified postprocedural states: Secondary | ICD-10-CM | POA: Diagnosis not present

## 2024-12-14 DIAGNOSIS — R040 Epistaxis: Secondary | ICD-10-CM | POA: Diagnosis not present

## 2024-12-14 DIAGNOSIS — J324 Chronic pansinusitis: Secondary | ICD-10-CM | POA: Diagnosis not present

## 2025-01-28 ENCOUNTER — Other Ambulatory Visit: Payer: Self-pay | Admitting: Obstetrics & Gynecology

## 2025-01-28 DIAGNOSIS — R928 Other abnormal and inconclusive findings on diagnostic imaging of breast: Secondary | ICD-10-CM

## 2025-01-29 ENCOUNTER — Ambulatory Visit

## 2025-01-29 ENCOUNTER — Other Ambulatory Visit: Payer: Self-pay | Admitting: Obstetrics & Gynecology

## 2025-01-29 ENCOUNTER — Ambulatory Visit
Admission: RE | Admit: 2025-01-29 | Discharge: 2025-01-29 | Disposition: A | Source: Ambulatory Visit | Attending: Obstetrics & Gynecology | Admitting: Obstetrics & Gynecology

## 2025-01-29 DIAGNOSIS — R928 Other abnormal and inconclusive findings on diagnostic imaging of breast: Secondary | ICD-10-CM
